# Patient Record
Sex: Female | Born: 1985 | Race: White | Hispanic: No | Marital: Married | Smoking: Never smoker
Health system: Southern US, Community
[De-identification: ages and names within clinical notes are randomized; demographics above are authoritative.]

## PROBLEM LIST (undated history)

## (undated) DIAGNOSIS — F410 Panic disorder [episodic paroxysmal anxiety] without agoraphobia: Secondary | ICD-10-CM

## (undated) DIAGNOSIS — F419 Anxiety disorder, unspecified: Secondary | ICD-10-CM

## (undated) DIAGNOSIS — G44229 Chronic tension-type headache, not intractable: Secondary | ICD-10-CM

## (undated) DIAGNOSIS — H9319 Tinnitus, unspecified ear: Secondary | ICD-10-CM

## (undated) DIAGNOSIS — J302 Other seasonal allergic rhinitis: Secondary | ICD-10-CM

## (undated) DIAGNOSIS — R42 Dizziness and giddiness: Secondary | ICD-10-CM

## (undated) DIAGNOSIS — F411 Generalized anxiety disorder: Secondary | ICD-10-CM

## (undated) DIAGNOSIS — I071 Rheumatic tricuspid insufficiency: Secondary | ICD-10-CM

## (undated) DIAGNOSIS — M9901 Segmental and somatic dysfunction of cervical region: Secondary | ICD-10-CM

## (undated) HISTORY — DX: Anxiety disorder, unspecified: F41.9

## (undated) HISTORY — DX: Segmental and somatic dysfunction of cervical region: M99.01

## (undated) HISTORY — DX: Panic disorder (episodic paroxysmal anxiety): F41.0

## (undated) HISTORY — DX: Chronic tension-type headache, not intractable: G44.229

## (undated) HISTORY — PX: WISDOM TOOTH EXTRACTION: SHX21

## (undated) HISTORY — DX: Dizziness and giddiness: R42

## (undated) HISTORY — DX: Rheumatic tricuspid insufficiency: I07.1

## (undated) HISTORY — DX: Tinnitus, unspecified ear: H93.19

## (undated) HISTORY — DX: Generalized anxiety disorder: F41.1

## (undated) HISTORY — DX: Other seasonal allergic rhinitis: J30.2

---

## 2015-04-29 LAB — LIPID PANEL
Cholesterol: 180 (ref 0–200)
HDL: 56 (ref 35–70)
LDL Cholesterol: 112
Triglycerides: 81 (ref 40–160)

## 2016-08-15 LAB — OB RESULTS CONSOLE RPR: RPR: NONREACTIVE

## 2016-08-15 LAB — OB RESULTS CONSOLE ANTIBODY SCREEN: ANTIBODY SCREEN: NEGATIVE

## 2016-08-15 LAB — OB RESULTS CONSOLE ABO/RH: RH Type: POSITIVE

## 2016-08-15 LAB — OB RESULTS CONSOLE GC/CHLAMYDIA
Chlamydia: NEGATIVE
Gonorrhea: NEGATIVE

## 2016-08-15 LAB — OB RESULTS CONSOLE HEPATITIS B SURFACE ANTIGEN: Hepatitis B Surface Ag: NEGATIVE

## 2016-08-15 LAB — OB RESULTS CONSOLE HIV ANTIBODY (ROUTINE TESTING): HIV: NONREACTIVE

## 2016-08-15 LAB — OB RESULTS CONSOLE RUBELLA ANTIBODY, IGM: RUBELLA: IMMUNE

## 2017-02-18 LAB — OB RESULTS CONSOLE GBS: GBS: NEGATIVE

## 2017-02-25 ENCOUNTER — Encounter (HOSPITAL_COMMUNITY): Payer: Self-pay | Admitting: *Deleted

## 2017-02-25 ENCOUNTER — Telehealth (HOSPITAL_COMMUNITY): Payer: Self-pay | Admitting: *Deleted

## 2017-02-25 NOTE — Telephone Encounter (Signed)
Preadmission screen  

## 2017-03-01 ENCOUNTER — Observation Stay (HOSPITAL_COMMUNITY)
Admission: RE | Admit: 2017-03-01 | Discharge: 2017-03-01 | Disposition: A | Discharge status: Home / Self Care | Payer: BLUE CROSS/BLUE SHIELD | Source: Ambulatory Visit | Attending: Obstetrics and Gynecology | Admitting: Obstetrics and Gynecology

## 2017-03-01 DIAGNOSIS — F419 Anxiety disorder, unspecified: Secondary | ICD-10-CM | POA: Diagnosis not present

## 2017-03-01 DIAGNOSIS — Z8349 Family history of other endocrine, nutritional and metabolic diseases: Secondary | ICD-10-CM | POA: Insufficient documentation

## 2017-03-01 DIAGNOSIS — Z809 Family history of malignant neoplasm, unspecified: Secondary | ICD-10-CM | POA: Insufficient documentation

## 2017-03-01 DIAGNOSIS — Z8249 Family history of ischemic heart disease and other diseases of the circulatory system: Secondary | ICD-10-CM | POA: Diagnosis not present

## 2017-03-01 DIAGNOSIS — R011 Cardiac murmur, unspecified: Secondary | ICD-10-CM | POA: Insufficient documentation

## 2017-03-01 DIAGNOSIS — Z8489 Family history of other specified conditions: Secondary | ICD-10-CM | POA: Insufficient documentation

## 2017-03-01 DIAGNOSIS — O321XX0 Maternal care for breech presentation, not applicable or unspecified: Principal | ICD-10-CM | POA: Insufficient documentation

## 2017-03-01 MED ORDER — TERBUTALINE SULFATE 1 MG/ML IJ SOLN
INTRAMUSCULAR | Status: AC
  Filled 2017-03-01: qty 1

## 2017-03-01 MED ORDER — LACTATED RINGERS IV SOLN
INTRAVENOUS | Status: DC
  Administered 2017-03-01 (×2): via INTRAVENOUS

## 2017-03-01 MED ORDER — TERBUTALINE SULFATE 1 MG/ML IJ SOLN
0.2500 mg | Freq: Once | INTRAMUSCULAR | Status: AC
  Administered 2017-03-01: 0.25 mg via SUBCUTANEOUS

## 2017-03-01 NOTE — Discharge Instructions (Signed)
Fetal Movement Counts Patient Name: ________________________________________________ Patient Due Date: ____________________ What is a fetal movement count? A fetal movement count is the number of times that you feel your baby move during a certain amount of time. This may also be called a fetal kick count. A fetal movement count is recommended for every pregnant woman. You may be asked to start counting fetal movements as early as week 28 of your pregnancy. Pay attention to when your baby is most active. You may notice your baby's sleep and wake cycles. You may also notice things that make your baby move more. You should do a fetal movement count:  When your baby is normally most active.  At the same time each day. A good time to count movements is while you are resting, after having something to eat and drink. How do I count fetal movements? 1. Find a quiet, comfortable area. Sit, or lie down on your side. 2. Write down the date, the start time and stop time, and the number of movements that you felt between those two times. Take this information with you to your health care visits. 3. For 2 hours, count kicks, flutters, swishes, rolls, and jabs. You should feel at least 10 movements during 2 hours. 4. You may stop counting after you have felt 10 movements. 5. If you do not feel 10 movements in 2 hours, have something to eat and drink. Then, keep resting and counting for 1 hour. If you feel at least 4 movements during that hour, you may stop counting. Contact a health care provider if:  You feel fewer than 4 movements in 2 hours.  Your baby is not moving like he or she usually does. Date: ____________ Start time: ____________ Stop time: ____________ Movements: ____________ Date: ____________ Start time: ____________ Stop time: ____________ Movements: ____________ Date: ____________ Start time: ____________ Stop time: ____________ Movements: ____________ Date: ____________ Start time:  ____________ Stop time: ____________ Movements: ____________ Date: ____________ Start time: ____________ Stop time: ____________ Movements: ____________ Date: ____________ Start time: ____________ Stop time: ____________ Movements: ____________ Date: ____________ Start time: ____________ Stop time: ____________ Movements: ____________ Date: ____________ Start time: ____________ Stop time: ____________ Movements: ____________ Date: ____________ Start time: ____________ Stop time: ____________ Movements: ____________ This information is not intended to replace advice given to you by your health care provider. Make sure you discuss any questions you have with your health care provider. Document Released: 01/09/2007 Document Revised: 08/08/2016 Document Reviewed: 01/19/2016 Elsevier Interactive Patient Education  2017 [employer] Braxton Hicks Contractions Contractions of the uterus can occur throughout pregnancy, but they are not always a sign that you are in labor. You may have practice contractions called Braxton Hicks contractions. These false labor contractions are sometimes confused with true labor. What are Braxton Hicks contractions? Braxton Hicks contractions are tightening movements that occur in the muscles of the uterus before labor. Unlike true labor contractions, these contractions do not result in opening (dilation) and thinning of the cervix. Toward the end of pregnancy (32-34 weeks), Braxton Hicks contractions can happen more often and may become stronger. These contractions are sometimes difficult to tell apart from true labor because they can be very uncomfortable. You should not feel embarrassed if you go to the hospital with false labor. Sometimes, the only way to tell if you are in true labor is for your health care provider to look for changes in the cervix. The health care provider will do a physical exam and may monitor your contractions. If you   are not in true labor, the exam  should show that your cervix is not dilating and your water has not broken. If there are no prenatal problems or other health problems associated with your pregnancy, it is completely safe for you to be sent home with false labor. You may continue to have Braxton Hicks contractions until you go into true labor. How can I tell the difference between true labor and false labor?  Differences  False labor  Contractions last 30-70 seconds.: Contractions are usually shorter and not as strong as true labor contractions.  Contractions become very regular.: Contractions are usually irregular.  Discomfort is usually felt in the top of the uterus, and it spreads to the lower abdomen and low back.: Contractions are often felt in the front of the lower abdomen and in the groin.  Contractions do not go away with walking.: Contractions may go away when you walk around or change positions while lying down.  Contractions usually become more intense and increase in frequency.: Contractions get weaker and are shorter-lasting as time goes on.  The cervix dilates and gets thinner.: The cervix usually does not dilate or become thin. Follow these instructions at home:  Take over-the-counter and prescription medicines only as told by your health care provider.  Keep up with your usual exercises and follow other instructions from your health care provider.  Eat and drink lightly if you think you are going into labor.  If Braxton Hicks contractions are making you uncomfortable:  Change your position from lying down or resting to walking, or change from walking to resting.  Sit and rest in a tub of warm water.  Drink enough fluid to keep your urine clear or pale yellow. Dehydration may cause these contractions.  Do slow and deep breathing several times an hour.  Keep all follow-up prenatal visits as told by your health care provider. This is important. Contact a health care provider if:  You have a  fever.  You have continuous pain in your abdomen. Get help right away if:  Your contractions become stronger, more regular, and closer together.  You have fluid leaking or gushing from your vagina.  You pass blood-tinged mucus (bloody show).  You have bleeding from your vagina.  You have low back pain that you never had before.  You feel your baby's head pushing down and causing pelvic pressure.  Your baby is not moving inside you as much as it used to. Summary  Contractions that occur before labor are called Braxton Hicks contractions, false labor, or practice contractions.  Braxton Hicks contractions are usually shorter, weaker, farther apart, and less regular than true labor contractions. True labor contractions usually become progressively stronger and regular and they become more frequent.  Manage discomfort from Braxton Hicks contractions by changing position, resting in a warm bath, drinking plenty of water, or practicing deep breathing. This information is not intended to replace advice given to you by your health care provider. Make sure you discuss any questions you have with your health care provider. Document Released: 12/10/2005 Document Revised: 10/29/2016 Document Reviewed: 10/29/2016 Elsevier Interactive Patient Education  2017 [employer] Inc[employer]  

## 2017-03-01 NOTE — H&P (Signed)
Christine NounKathryn Carey is a 31 y.o. female presenting for ECV.  Pregnancy uncomplicated except frank breech presentation. OB History    Gravida Para Term Preterm AB Living   2 1       1    SAB TAB Ectopic Multiple Live Births           1     Past Medical History:  Diagnosis Date  . Anxiety   . Heart murmur    slight regurgitation   Past Surgical History:  Procedure Laterality Date  . WISDOM TOOTH EXTRACTION     Family History: family history includes Birth defects in her daughter; Cancer in her maternal grandmother; Hypertension in her father and mother; Thyroid disease in her mother. Social History:  reports that she has never smoked. She has never used smokeless tobacco. She reports that she does not drink alcohol or use drugs.     Maternal Diabetes: No Genetic Screening: Normal Maternal Ultrasounds/Referrals: Normal Fetal Ultrasounds or other Referrals:  None Maternal Substance Abuse:  No Significant Maternal Medications:  None Significant Maternal Lab Results:  None Other Comments:  None  ROS History   Height 4\' 11"  (1.499 m), weight 166 lb (75.3 kg), last menstrual period 06/12/2016. Exam Physical Exam  Prenatal labs: ABO, Rh: B/Positive/-- (08/23 0000) Antibody: Negative (08/23 0000) Rubella: Immune (08/23 0000) RPR: Nonreactive (08/23 0000)  HBsAg: Negative (08/23 0000)  HIV: Non-reactive (08/23 0000)  GBS:     Assessment/Plan: IUP at term Homero FellersFrank breech presentation Desires attempt at ECV R&B discussed and informed consent obtained    Christine Carey 03/01/2017, 8:09 AM

## 2017-03-01 NOTE — Discharge Summary (Signed)
Physician Discharge Summary  Patient ID: Christine NounKathryn Carey MRN: [id number] DOB/AGE: 01-08-1986 31 y.o.  Admit date: 03/01/2017 Discharge date: 03/01/2017  Admission Diagnoses:  Discharge Diagnoses:  Active Problems:   * No active hospital problems. *   Discharged Condition: good  Hospital Course: Pt admitted to L&D for ECV attempt.  Successful ECV to Vertex wtihout complications,  Normal EFM after procedure for 1 hour.  Pt d/cd with labor warnings and kickcounts  Consults: None  Significant Diagnostic Studies: labs:    Treatments: ECV  Discharge Exam: Height 4\' 11"  (1.499 m), weight 166 lb (75.3 kg), last menstrual period 06/12/2016. General appearance: alert, cooperative, appears stated age and no distress ABd gravidFHR cat 1  Disposition: Final discharge disposition not confirmed  Discharge Instructions    Diet general    Complete by:  As directed    Increase activity slowly    Complete by:  As directed      Allergies as of 03/01/2017   No Known Allergies     Medication List    You have not been prescribed any medications.      Signed: Jakell Trusty C 03/01/2017, 8:23 AM

## 2017-03-01 NOTE — Procedures (Signed)
External Cephalic Version ? ?Pt was confirmed breech by ultrasound today.  She desires attempt at ECV.  Risks and benefits have been discussed at length and informed consent was obtained. ?EFM with reactive tracing and patient was given terbutaline .25mg SQ.  IV had been placed and Ultrasound at bedside. ? ?Fetal vertex in LUQ was confirmed and fetal buttocks was elevated out of the pelvis.  In a counterclockwise fashion, ECV was carried out with successful placement of fetal vertex in the cephalic position confirmed by ultrasound. ? ?Pt and fetus tolerated the procedure well.  EFM was reassuring immediately after the procedure and the patient was excited and reassured by the procedure. ?Plan to monitor EFM for 1 hour and if remains reassuring will discharge the patient home with routine labor warnings and kickcounts.  Follow up in the office as scheduled next week.  ?

## 2017-03-13 ENCOUNTER — Inpatient Hospital Stay (HOSPITAL_COMMUNITY): Admission: AD | Admit: 2017-03-13 | Payer: Self-pay | Source: Ambulatory Visit | Admitting: Obstetrics and Gynecology

## 2017-03-18 ENCOUNTER — Telehealth (HOSPITAL_COMMUNITY): Payer: Self-pay | Admitting: *Deleted

## 2017-03-18 NOTE — Telephone Encounter (Signed)
Preadmission screen  

## 2017-03-21 ENCOUNTER — Inpatient Hospital Stay (HOSPITAL_COMMUNITY)
Admission: RE | Admit: 2017-03-21 | Discharge: 2017-03-23 | DRG: 774 | Disposition: A | Discharge status: Home / Self Care | Payer: BLUE CROSS/BLUE SHIELD | Source: Ambulatory Visit | Attending: Obstetrics and Gynecology | Admitting: Obstetrics and Gynecology

## 2017-03-21 ENCOUNTER — Encounter (HOSPITAL_COMMUNITY): Payer: Self-pay

## 2017-03-21 ENCOUNTER — Inpatient Hospital Stay (HOSPITAL_COMMUNITY): Payer: BLUE CROSS/BLUE SHIELD | Admitting: Anesthesiology

## 2017-03-21 DIAGNOSIS — Z8249 Family history of ischemic heart disease and other diseases of the circulatory system: Secondary | ICD-10-CM

## 2017-03-21 DIAGNOSIS — O9089 Other complications of the puerperium, not elsewhere classified: Secondary | ICD-10-CM | POA: Diagnosis present

## 2017-03-21 DIAGNOSIS — Z3A40 40 weeks gestation of pregnancy: Secondary | ICD-10-CM

## 2017-03-21 DIAGNOSIS — G629 Polyneuropathy, unspecified: Secondary | ICD-10-CM | POA: Diagnosis present

## 2017-03-21 DIAGNOSIS — O480 Post-term pregnancy: Secondary | ICD-10-CM | POA: Diagnosis present

## 2017-03-21 DIAGNOSIS — Z3493 Encounter for supervision of normal pregnancy, unspecified, third trimester: Secondary | ICD-10-CM | POA: Diagnosis present

## 2017-03-21 LAB — TYPE AND SCREEN
ABO/RH(D): B POS
ANTIBODY SCREEN: NEGATIVE

## 2017-03-21 LAB — CBC
HCT: 32.2 % — ABNORMAL LOW (ref 36.0–46.0)
Hemoglobin: 10.3 g/dL — ABNORMAL LOW (ref 12.0–15.0)
MCH: 27.8 pg (ref 26.0–34.0)
MCHC: 32 g/dL (ref 30.0–36.0)
MCV: 87 fL (ref 78.0–100.0)
PLATELETS: 315 10*3/uL (ref 150–400)
RBC: 3.7 MIL/uL — ABNORMAL LOW (ref 3.87–5.11)
RDW: 14.4 % (ref 11.5–15.5)
WBC: 9.6 10*3/uL (ref 4.0–10.5)

## 2017-03-21 LAB — ABO/RH: ABO/RH(D): B POS

## 2017-03-21 MED ORDER — OXYTOCIN BOLUS FROM INFUSION
500.0000 mL | Freq: Once | INTRAVENOUS | Status: AC
  Administered 2017-03-21: 500 mL via INTRAVENOUS

## 2017-03-21 MED ORDER — FENTANYL 2.5 MCG/ML BUPIVACAINE 1/10 % EPIDURAL INFUSION (WH - ANES)
14.0000 mL/h | INTRAMUSCULAR | Status: DC | PRN
  Administered 2017-03-21: 14 mL/h via EPIDURAL
  Filled 2017-03-21: qty 100

## 2017-03-21 MED ORDER — SIMETHICONE 80 MG PO CHEW: 80.0000 mg | CHEWABLE_TABLET | ORAL | Status: DC | PRN

## 2017-03-21 MED ORDER — OXYCODONE-ACETAMINOPHEN 5-325 MG PO TABS: 2.0000 | ORAL_TABLET | ORAL | Status: DC | PRN

## 2017-03-21 MED ORDER — DIPHENHYDRAMINE HCL 50 MG/ML IJ SOLN: 12.5000 mg | INTRAMUSCULAR | Status: DC | PRN

## 2017-03-21 MED ORDER — PHENYLEPHRINE 40 MCG/ML (10ML) SYRINGE FOR IV PUSH (FOR BLOOD PRESSURE SUPPORT)
80.0000 ug | PREFILLED_SYRINGE | INTRAVENOUS | Status: DC | PRN
  Filled 2017-03-21: qty 5

## 2017-03-21 MED ORDER — ZOLPIDEM TARTRATE 5 MG PO TABS: 5.0000 mg | ORAL_TABLET | Freq: Every evening | ORAL | Status: DC | PRN

## 2017-03-21 MED ORDER — OXYCODONE HCL 5 MG PO TABS: 5.0000 mg | ORAL_TABLET | ORAL | Status: DC | PRN

## 2017-03-21 MED ORDER — LACTATED RINGERS IV SOLN: 500.0000 mL | Freq: Once | INTRAVENOUS | Status: DC

## 2017-03-21 MED ORDER — IBUPROFEN 600 MG PO TABS
600.0000 mg | ORAL_TABLET | Freq: Four times a day (QID) | ORAL | Status: DC
  Administered 2017-03-21 – 2017-03-23 (×7): 600 mg via ORAL
  Filled 2017-03-21 (×7): qty 1

## 2017-03-21 MED ORDER — SENNOSIDES-DOCUSATE SODIUM 8.6-50 MG PO TABS
2.0000 | ORAL_TABLET | ORAL | Status: DC
  Administered 2017-03-21 – 2017-03-22 (×2): 2 via ORAL
  Filled 2017-03-21 (×2): qty 2

## 2017-03-21 MED ORDER — PHENYLEPHRINE 40 MCG/ML (10ML) SYRINGE FOR IV PUSH (FOR BLOOD PRESSURE SUPPORT)
80.0000 ug | PREFILLED_SYRINGE | INTRAVENOUS | Status: DC | PRN
  Filled 2017-03-21: qty 5
  Filled 2017-03-21: qty 10

## 2017-03-21 MED ORDER — DIPHENHYDRAMINE HCL 25 MG PO CAPS: 25.0000 mg | ORAL_CAPSULE | Freq: Four times a day (QID) | ORAL | Status: DC | PRN

## 2017-03-21 MED ORDER — LACTATED RINGERS IV SOLN
INTRAVENOUS | Status: DC
  Administered 2017-03-21: 01:00:00 via INTRAVENOUS

## 2017-03-21 MED ORDER — MISOPROSTOL 25 MCG QUARTER TABLET
25.0000 ug | ORAL_TABLET | ORAL | Status: DC | PRN
  Administered 2017-03-21: 25 ug via VAGINAL
  Filled 2017-03-21 (×2): qty 1

## 2017-03-21 MED ORDER — EPHEDRINE 5 MG/ML INJ
10.0000 mg | INTRAVENOUS | Status: DC | PRN
  Filled 2017-03-21: qty 2

## 2017-03-21 MED ORDER — BUTORPHANOL TARTRATE 1 MG/ML IJ SOLN: 1.0000 mg | INTRAMUSCULAR | Status: DC | PRN

## 2017-03-21 MED ORDER — FLEET ENEMA 7-19 GM/118ML RE ENEM: 1.0000 | ENEMA | RECTAL | Status: DC | PRN

## 2017-03-21 MED ORDER — ONDANSETRON HCL 4 MG/2ML IJ SOLN: 4.0000 mg | INTRAMUSCULAR | Status: DC | PRN

## 2017-03-21 MED ORDER — ONDANSETRON HCL 4 MG PO TABS: 4.0000 mg | ORAL_TABLET | ORAL | Status: DC | PRN

## 2017-03-21 MED ORDER — ACETAMINOPHEN 325 MG PO TABS
650.0000 mg | ORAL_TABLET | ORAL | Status: DC | PRN
  Administered 2017-03-21 – 2017-03-22 (×3): 650 mg via ORAL
  Filled 2017-03-21 (×4): qty 2

## 2017-03-21 MED ORDER — OXYTOCIN 40 UNITS IN LACTATED RINGERS INFUSION - SIMPLE MED
2.5000 [IU]/h | INTRAVENOUS | Status: DC
  Administered 2017-03-21: 2.5 [IU]/h via INTRAVENOUS

## 2017-03-21 MED ORDER — SOD CITRATE-CITRIC ACID 500-334 MG/5ML PO SOLN: 30.0000 mL | ORAL | Status: DC | PRN

## 2017-03-21 MED ORDER — TETANUS-DIPHTH-ACELL PERTUSSIS 5-2.5-18.5 LF-MCG/0.5 IM SUSP: 0.5000 mL | Freq: Once | INTRAMUSCULAR | Status: DC

## 2017-03-21 MED ORDER — OXYTOCIN 40 UNITS IN LACTATED RINGERS INFUSION - SIMPLE MED: 1.0000 m[IU]/min | INTRAVENOUS | Status: DC

## 2017-03-21 MED ORDER — WITCH HAZEL-GLYCERIN EX PADS: 1.0000 "application " | MEDICATED_PAD | CUTANEOUS | Status: DC | PRN

## 2017-03-21 MED ORDER — ACETAMINOPHEN 325 MG PO TABS: 650.0000 mg | ORAL_TABLET | ORAL | Status: DC | PRN

## 2017-03-21 MED ORDER — BENZOCAINE-MENTHOL 20-0.5 % EX AERO
1.0000 "application " | INHALATION_SPRAY | CUTANEOUS | Status: DC | PRN
  Administered 2017-03-21: 1 via TOPICAL
  Filled 2017-03-21: qty 56

## 2017-03-21 MED ORDER — LIDOCAINE HCL (PF) 1 % IJ SOLN
INTRAMUSCULAR | Status: DC | PRN
  Administered 2017-03-21: 6 mL
  Administered 2017-03-21: 4 mL via EPIDURAL

## 2017-03-21 MED ORDER — OXYTOCIN 40 UNITS IN LACTATED RINGERS INFUSION - SIMPLE MED
1.0000 m[IU]/min | INTRAVENOUS | Status: DC
  Administered 2017-03-21: 1 m[IU]/min via INTRAVENOUS
  Filled 2017-03-21: qty 1000

## 2017-03-21 MED ORDER — COCONUT OIL OIL: 1.0000 "application " | TOPICAL_OIL | Status: DC | PRN

## 2017-03-21 MED ORDER — PRENATAL MULTIVITAMIN CH
1.0000 | ORAL_TABLET | Freq: Every day | ORAL | Status: DC
  Administered 2017-03-22 – 2017-03-23 (×2): 1 via ORAL
  Filled 2017-03-21 (×2): qty 1

## 2017-03-21 MED ORDER — OXYCODONE-ACETAMINOPHEN 5-325 MG PO TABS: 1.0000 | ORAL_TABLET | ORAL | Status: DC | PRN

## 2017-03-21 MED ORDER — ONDANSETRON HCL 4 MG/2ML IJ SOLN: 4.0000 mg | Freq: Four times a day (QID) | INTRAMUSCULAR | Status: DC | PRN

## 2017-03-21 MED ORDER — OXYCODONE HCL 5 MG PO TABS
10.0000 mg | ORAL_TABLET | ORAL | Status: DC | PRN
  Filled 2017-03-21: qty 2

## 2017-03-21 MED ORDER — ZOLPIDEM TARTRATE 5 MG PO TABS
5.0000 mg | ORAL_TABLET | Freq: Every evening | ORAL | Status: DC | PRN
  Administered 2017-03-21: 5 mg via ORAL
  Filled 2017-03-21: qty 1

## 2017-03-21 MED ORDER — LIDOCAINE HCL (PF) 1 % IJ SOLN
30.0000 mL | INTRAMUSCULAR | Status: DC | PRN
  Administered 2017-03-21: 30 mL via SUBCUTANEOUS
  Filled 2017-03-21: qty 30

## 2017-03-21 MED ORDER — LACTATED RINGERS IV SOLN: 500.0000 mL | INTRAVENOUS | Status: DC | PRN

## 2017-03-21 MED ORDER — DIBUCAINE 1 % RE OINT: 1.0000 "application " | TOPICAL_OINTMENT | RECTAL | Status: DC | PRN

## 2017-03-21 MED ORDER — TERBUTALINE SULFATE 1 MG/ML IJ SOLN
0.2500 mg | Freq: Once | INTRAMUSCULAR | Status: DC | PRN
  Filled 2017-03-21: qty 1

## 2017-03-21 NOTE — Progress Notes (Signed)
This RN assumed care from Jamey ReasL Edmonds, RN at [phone]. Both RNs at bedside.

## 2017-03-21 NOTE — Progress Notes (Signed)
Delivery addendum   Apgars 3,8  Placenta already in trash, unable to send to pathology

## 2017-03-21 NOTE — Progress Notes (Signed)
Operative Delivery Note At 7:43 PM a viable female was delivered via .  Presentation: vertex; Position: Left,, Occiput,, Anterior; Station: +3.  Verbal consent: obtained from patient.  Risks and benefits discussed in detail.  Risks include, but are not limited to the risks of anesthesia, bleeding, infection, damage to maternal tissues, fetal cephalhematoma.  There is also the risk of inability to effect vaginal delivery of the head, or shoulder dystocia that cannot be resolved by established maneuvers, leading to the need for emergency cesarean section.  Pushing to +3, FHT 70-100 Mushroom VE applied with some progress, then bell VE applied with good progress  APGAR: 6, 8; weight pending .   Placenta status:intact,path .   Cord:3 vessels  with the following complications: .  Cord pH: pending Cord around shoulder x 1 Anesthesia:  epidural Instruments: mushroom, then bell VE- 1 popoff Episiotomy:  Second degree-repaired Lacerations:   Suture Repair: 2.0 vicryl rapide Est. Blood Loss (mL):  350  Mom to postpartum.  Baby to Couplet care / Skin to Skin.  Elizeo Rodriques II,Andrick Rust E 03/21/2017, 8:04 PM

## 2017-03-21 NOTE — Progress Notes (Signed)
FHT cat one UCs q2-4 min Cx no change/vtx

## 2017-03-21 NOTE — Anesthesia Preprocedure Evaluation (Signed)
Anesthesia Evaluation  Patient identified by MRN, date of birth, ID band Patient awake    Reviewed: Allergy & Precautions, H&P , Patient's Chart, lab work & pertinent test results  Airway Mallampati: II  TM Distance: >3 FB Neck ROM: full    Dental no notable dental hx.    Pulmonary    Pulmonary exam normal breath sounds clear to auscultation       Cardiovascular Exercise Tolerance: Good  Rhythm:regular Rate:Normal     Neuro/Psych    GI/Hepatic   Endo/Other    Renal/GU      Musculoskeletal   Abdominal   Peds  Hematology   Anesthesia Other Findings   Reproductive/Obstetrics                             Anesthesia Physical Anesthesia Plan  ASA: II  Anesthesia Plan: Epidural   Post-op Pain Management:    Induction:   Airway Management Planned:   Additional Equipment:   Intra-op Plan:   Post-operative Plan:   Informed Consent: I have reviewed the patients History and Physical, chart, labs and discussed the procedure including the risks, benefits and alternatives for the proposed anesthesia with the patient or authorized representative who has indicated his/her understanding and acceptance.   Dental Advisory Given  Plan Discussed with:   Anesthesia Plan Comments: (Labs checked- platelets confirmed with RN in room. Fetal heart tracing, per RN, reported to be stable enough for sitting procedure. Discussed epidural, and patient consents to the procedure:  included risk of possible headache,backache, failed block, allergic reaction, and nerve injury. This patient was asked if she had any questions or concerns before the procedure started.)        Anesthesia Quick Evaluation  

## 2017-03-21 NOTE — H&P (Signed)
Carmelina NounKathryn Ahlberg is a 31 y.o. female presenting for IOL. Pregnancy complicated by breech now S/P ECV. S/P Cytotec early this am. OB History    Gravida Para Term Preterm AB Living   2 1       1    SAB TAB Ectopic Multiple Live Births           1     Past Medical History:  Diagnosis Date  . Anxiety   . Heart murmur    tricuspid valve prolapse   Past Surgical History:  Procedure Laterality Date  . WISDOM TOOTH EXTRACTION     Family History: family history includes Birth defects in her daughter; Cancer in her maternal grandmother; Hypertension in her father and mother; Thyroid disease in her mother. Social History:  reports that she has never smoked. She has never used smokeless tobacco. She reports that she does not drink alcohol or use drugs.     Maternal Diabetes: No Genetic Screening: Normal Maternal Ultrasounds/Referrals: Normal Fetal Ultrasounds or other Referrals:  None Maternal Substance Abuse:  No Significant Maternal Medications:  None Significant Maternal Lab Results:  None Other Comments:  None  Review of Systems  Eyes: Negative for blurred vision.  Gastrointestinal: Negative for abdominal pain.  Neurological: Negative for headaches.   Maternal Medical History:  Fetal activity: Perceived fetal activity is normal.      Dilation: 3 Effacement (%): 70 Station: -3 Exam by:: Dr. Henderson Cloudomblin Blood pressure 109/69, pulse 74, temperature 97.9 F (36.6 C), temperature source Oral, resp. rate 16, height 4\' 11"  (1.499 m), weight 165 lb (74.8 kg), last menstrual period 06/12/2016. Maternal Exam:  Uterine Assessment: Contraction strength is mild.  Contraction frequency is irregular.   Abdomen: Patient reports no abdominal tenderness. Fetal presentation: vertex     Fetal Exam Fetal State Assessment: Category I - tracings are normal.     Physical Exam  Cardiovascular: Normal rate and regular rhythm.   Respiratory: Effort normal and breath sounds normal.  GI: Soft.  There is no tenderness.  Neurological: She has normal reflexes.    Cx 3/70/soft/-3 Bedside U/S = vtx  Prenatal labs: ABO, Rh: --/--/B POS, B POS (03/29 0120) Antibody: NEG (03/29 0120) Rubella: Immune (08/23 0000) RPR: Nonreactive (08/23 0000)  HBsAg: Negative (08/23 0000)  HIV: Non-reactive (08/23 0000)  GBS: Negative (02/26 0000)   Assessment/Plan: 31 yo G2P1 @ 40 2/7 weeks for IOL Will begin pitocin D/W patient and husband induction and risks of pitocin   Analea Muller II,Floyce Bujak E 03/21/2017, 7:55 AM

## 2017-03-21 NOTE — Anesthesia Pain Management Evaluation Note (Signed)
  [facility] Pain Management Visit Note  Patient: Christine NounKathryn Matherly, 31 y.o., female  "[facility] We have an anesthesia team available at all times to provide care throughout the hospital, including epidural management and anesthesia for C-section. I don't know your plan for the delivery whether it a natural birth, water birth, IV sedation, nitrous supplementation, doula or epidural, but we want to meet your pain goals."   1.Was your pain managed to your expectations on prior hospitalizations?   Yes   2.What is your expectation for pain management during this hospitalization?     Epidural  3.How can we help you reach that goal?   Record the patient's initial score and the patient's pain goal.   Pain: 5  Pain Goal: 5 [facility] wants you to be able to say your pain was always managed very well.  Laban EmperorMalinova,Shiv Shuey Hristova 03/21/2017

## 2017-03-21 NOTE — Anesthesia Procedure Notes (Signed)
Epidural Patient location during procedure: OB  Staffing Anesthesiologist: Tylee Yum  Preanesthetic Checklist Completed: patient identified, site marked, surgical consent, pre-op evaluation, timeout performed, IV checked, risks and benefits discussed and monitors and equipment checked  Epidural Patient position: sitting Prep: site prepped and draped and DuraPrep Patient monitoring: continuous pulse ox and blood pressure Approach: midline Location: L3-L4 Injection technique: LOR air  Needle:  Needle type: Tuohy  Needle gauge: 17 G Needle length: 9 cm and 9 Needle insertion depth: 5 cm cm Catheter type: closed end flexible Catheter size: 19 Gauge Catheter at skin depth: 10 cm Test dose: negative  Assessment Events: blood not aspirated, injection not painful, no injection resistance, negative IV test and no paresthesia  Additional Notes Dosing of Epidural:  1st dose, through catheter .............................................  Xylocaine 40 mg  2nd dose, through catheter, after waiting 3 minutes.........Xylocaine 60 mg    As each dose occurred, patient was free of IV sx; and patient exhibited no evidence of SA injection.  Patient is more comfortable after epidural dosed. Please see RN's note for documentation of vital signs,and FHR which are stable.  Patient reminded not to try to ambulate with numb legs, and that an RN must be present when she attempts to get up.        

## 2017-03-21 NOTE — Progress Notes (Signed)
FHT cat one cx 6/C/-2/vtx AROM clear

## 2017-03-22 LAB — CBC
HCT: 29.9 % — ABNORMAL LOW (ref 36.0–46.0)
Hemoglobin: 10 g/dL — ABNORMAL LOW (ref 12.0–15.0)
MCH: 28.6 pg (ref 26.0–34.0)
MCHC: 33.4 g/dL (ref 30.0–36.0)
MCV: 85.4 fL (ref 78.0–100.0)
Platelets: 273 10*3/uL (ref 150–400)
RBC: 3.5 MIL/uL — AB (ref 3.87–5.11)
RDW: 14.6 % (ref 11.5–15.5)
WBC: 19.3 10*3/uL — AB (ref 4.0–10.5)

## 2017-03-22 LAB — RPR: RPR Ser Ql: NONREACTIVE

## 2017-03-22 NOTE — Anesthesia Postprocedure Evaluation (Signed)
Anesthesia Post Note  Patient: Christine Carey  Procedure(s) Performed: * No procedures listed *  Patient location during evaluation: Mother Baby Anesthesia Type: Epidural Level of consciousness: awake and alert and oriented Pain management: pain level controlled Vital Signs Assessment: post-procedure vital signs reviewed and stable Respiratory status: spontaneous breathing and nonlabored ventilation Cardiovascular status: stable Postop Assessment: no headache, no backache, epidural receding, patient able to bend at knees, no signs of nausea or vomiting and adequate PO intake Anesthetic complications: no        Last Vitals:  Vitals:   03/21/17 2320 03/22/17 0340  BP: 136/66 113/66  Pulse: (!) 59 (!) 52  Resp: 18 18  Temp: 36.8 C 36.6 C    Last Pain:  Vitals:   03/22/17 0506  TempSrc:   PainSc: 4    Pain Goal:                 Laban Emperor

## 2017-03-22 NOTE — Progress Notes (Signed)
Post Partum Day 1 Subjective: no complaints and up ad lib  Objective: Blood pressure 113/66, pulse (!) 52, temperature 97.9 F (36.6 C), temperature source Oral, resp. rate 18, height  (1.499 m), weight 165 lb (74.8 kg), last menstrual period 06/12/2016, SpO2 99 %, unknown if currently breastfeeding.  Physical Exam:  General: alert and cooperative Lochia: appropriate Uterine Fundus: firm Incision: n/a DVT Evaluation: No evidence of DVT seen on physical exam.   Recent Labs  03/21/17 0120 03/22/17 0501  HGB 10.3* 10.0*  HCT 32.2* 29.9*    Assessment/Plan: Plan for discharge tomorrow   LOS: 1 day   Christine Carey 03/22/2017, 9:06 AM

## 2017-03-22 NOTE — Lactation Note (Signed)
This note was copied from a baby's chart. Lactation Consultation Note experienced BF mom has a 2 1/30 yr old daughter that she BF for 2 yrs. Mom states this baby has latched ok. Had some good feedings then became sleepy.  Reviewed newborn feeding habits and behavior. Mom encouraged to feed baby 8-12 times/24 hours and with feeding cues. Mom encouraged to waken baby for feeds.  Encouraged STS and I&O. Encouraged mom to call for any assistance, questions, or concerns.  [facility]/LC brochure given w/resources, support groups and LC services. Patient Name: Christine Carey [id number] Date: 03/22/2017 Reason for consult: Initial assessment   Maternal Data Has patient been taught Hand Expression?: Yes Does the patient have breastfeeding experience prior to this delivery?: Yes  Feeding Feeding Type: Breast Fed Length of feed: 5 min  LATCH Score/Interventions                      Lactation Tools Discussed/Used     Consult Status Consult Status: Follow-up Date: 03/23/17 Follow-up type: In-patient    Charyl Dancer 03/22/2017, 6:13 AM

## 2017-03-22 NOTE — Lactation Note (Signed)
This note was copied from a baby's chart. Lactation Consultation Note: Experienced BF mom has baby latched to the breast when I went into room. Lots of swallows noted. Nipple slightly pinched when baby came off the breast Encouraged to keep baby close to the breast throughout the feeding. Mom able to hand express whitish milk after nursing. Baby off to sleep. No questions at present. To call prn  Patient Name: Christine Carey [id number]'[id number] Date: 03/22/2017 Reason for consult: Follow-up assessment   Maternal Data Formula Feeding for Exclusion: No Has patient been taught Hand Expression?: Yes Does the patient have breastfeeding experience prior to this delivery?: Yes  Feeding Feeding Type: Breast Fed Length of feed: 15 min  LATCH Score/Interventions Latch: Grasps breast easily, tongue down, lips flanged, rhythmical sucking.  Audible Swallowing: Spontaneous and intermittent  Type of Nipple: Everted at rest and after stimulation  Comfort (Breast/Nipple): Soft / non-tender     Hold (Positioning): No assistance needed to correctly position infant at breast.  LATCH Score: 10  Lactation Tools Discussed/Used [facility] Program: No   Consult Status Consult Status: PRN    Pamelia Hoit 03/22/2017, 1:59 PM

## 2017-03-22 NOTE — Progress Notes (Signed)
Patient complaining of numbness and weakness right lower extremity since delivery yesterday pm. She had a labor epidural placed yesterday which ran for about 7 hours. Epidural insertion was uneventful with no reported paresthesias. She states she some right sided pelvic pain during her labor and was lying predominantly on her right side. She pushed for approximately 45 min with her legs in the hyperflexed position. She had persistent numbness and weakness in her right leg. She states that the numbness is primarily in her right thigh. She reports difficulty with ambulation, though this has improved since this am. She denies any bowel or bladder dysfunction. 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 Discussed with the patient and her relatives. Will reevaluate in the am prior to discharge.

## 2017-03-23 MED ORDER — IBUPROFEN 600 MG PO TABS: 600.0000 mg | ORAL_TABLET | Freq: Four times a day (QID) | ORAL | 0 refills | Status: DC

## 2017-03-23 NOTE — Discharge Summary (Signed)
Obstetric Discharge Summary Reason for Admission: induction of labor Prenatal Procedures: ultrasound Intrapartum Procedures: spontaneous vaginal delivery Postpartum Procedures: none Complications-Operative and Postpartum: mild neuropathy Hemoglobin  Date Value Ref Range Status  03/22/2017 10.0 (L) 12.0 - 15.0 g/dL Final   HCT  Date Value Ref Range Status  03/22/2017 29.9 (L) 36.0 - 46.0 % Final    Physical Exam:  General: alert and cooperative Lochia: appropriate Uterine Fundus: firm Incision: no significant drainage DVT Evaluation: No evidence of DVT seen on physical exam.  Discharge Diagnoses: Term Pregnancy-delivered  Discharge Information: Date: 03/23/2017 Activity: pelvic rest Diet: routine Medications: PNV, Ibuprofen and Percocet Condition: stable Instructions: refer to practice specific booklet Discharge to: home Follow-up Information    [facility]. Schedule an appointment as soon as possible for a visit in 6 week(s).   Contact information: [address] [place] [address] [phone]           Newborn Data: Live born female  Birth Weight: 8 lb 8.2 oz (3860 g) APGAR: 3, 8  Home with mother.  Zohair Epp 03/23/2017, 8:49 AM

## 2017-03-23 NOTE — Lactation Note (Signed)
This note was copied from a baby's chart. Lactation Consultation Note  Patient Name: Christine Carey [id number] Date: 03/23/2017 Reason for consult: Follow-up assessment;Infant weight loss  LC reviewed doc flow sheets and updated per mom  LC assisted with latch and showed mom depth , multiple swallows  Noted, increased with breast compressions. - see doc flow sheets for details  Sore nipple and and engorgement prevention and tx. Per mom familiar with engorgement  From her 1st baby. LC reviewed the importance of STS feedings until the baby can stay awake for a feeding. Discussed nutritive vs non- nutritive feeding patterns, and watch for hanging out when latched.  Mother informed of post-discharge support and given phone number to the lactation department, including services for phone call assistance; out-patient appointments; and breastfeeding support group. List of other breastfeeding resources in the community given in the handout. Encouraged mother to call for problems or concerns related to breastfeeding.   Maternal Data    Feeding Feeding Type: Breast Fed Length of feed: 10 min (multiple swallows )  LATCH Score/Interventions Latch: Grasps breast easily, tongue down, lips flanged, rhythmical sucking. Intervention(s): Adjust position;Assist with latch;Breast massage;Breast compression  Audible Swallowing: Spontaneous and intermittent  Type of Nipple: Everted at rest and after stimulation  Comfort (Breast/Nipple): Filling, red/small blisters or bruises, mild/mod discomfort     Hold (Positioning): Assistance needed to correctly position infant at breast and maintain latch. Intervention(s): Breastfeeding basics reviewed;Support Pillows;Position options;Skin to skin  LATCH Score: 8  Lactation Tools Discussed/Used Pump Review: Setup, frequency, and cleaning Initiated by:: MAI  Date initiated:: 03/23/17   Consult Status Consult Status: Complete Date: 03/23/17    Kathrin Greathouse 03/23/2017, 9:48 AM

## 2017-04-08 ENCOUNTER — Ambulatory Visit: Payer: BLUE CROSS/BLUE SHIELD | Admitting: Neurology

## 2017-04-08 ENCOUNTER — Telehealth: Payer: Self-pay | Admitting: *Deleted

## 2017-04-08 NOTE — Telephone Encounter (Signed)
Left message letting her know our office is without power.  Provided our number to call back to reschedule. 

## 2017-04-24 ENCOUNTER — Ambulatory Visit: Payer: BLUE CROSS/BLUE SHIELD | Admitting: Diagnostic Neuroimaging

## 2018-04-23 ENCOUNTER — Ambulatory Visit (INDEPENDENT_AMBULATORY_CARE_PROVIDER_SITE_OTHER): Payer: BLUE CROSS/BLUE SHIELD | Admitting: Family Medicine

## 2018-04-23 ENCOUNTER — Other Ambulatory Visit: Payer: Self-pay

## 2018-04-23 ENCOUNTER — Encounter: Payer: Self-pay | Admitting: Family Medicine

## 2018-04-23 VITALS — BP 104/62 | HR 66 | Temp 98.7°F | Ht 59.5 in | Wt 119.2 lb

## 2018-04-23 DIAGNOSIS — Z975 Presence of (intrauterine) contraceptive device: Secondary | ICD-10-CM | POA: Diagnosis not present

## 2018-04-23 DIAGNOSIS — F411 Generalized anxiety disorder: Secondary | ICD-10-CM | POA: Insufficient documentation

## 2018-04-23 DIAGNOSIS — F419 Anxiety disorder, unspecified: Secondary | ICD-10-CM | POA: Diagnosis not present

## 2018-04-23 NOTE — Progress Notes (Signed)
Subjective  CC:  Chief Complaint  Patient presents with  . Establish Care    Transfer from [facility], Dr. Irving Burton- Moved to [place]  . Anxiety    HPI: Christine Carey is a 32 y.o. female who presents to [facility] today to establish care with me as a new patient.   She has the following concerns or needs:  Very pleasant healthy 32 yo G2P2002 with a 71 yo and 1 yo daughter. Has mirena IUD in place. Doing well physically.   Main concern is worry/anxiety; reports she has "always" been an anxious person since adolescence. It has never caused a problem. However, now with 89 yo daughter who has a unilateral kidney, congenital. She finds herself constantly worried about her health. She is over vigilant. Feels anxious and worries that something will happen to that kidney. She denies low mood, panic attacks or anxiety negatively impacting her sleep, appetite, motivation or relationships. Her husband is reassuring and supportive (he is  Teacher, early years/pre). However, she would like help to decrease the constant stress.  GAD 7 : Generalized Anxiety Score 04/23/2018  Nervous, Anxious, on Edge 1  Control/stop worrying 1  Worry too much - different things 2  Trouble relaxing 1  Restless 0  Easily annoyed or irritable 1  Afraid - awful might happen 2  Total GAD 7 Score 8  Anxiety Difficulty Somewhat difficult   PHQ2 score 0   We updated and reviewed the patient's past history in detail and it is documented below.  Patient Active Problem List   Diagnosis Date Noted  . IUD (intrauterine device) in place - May 2018, Mirena 04/23/2018    Dr. Vickey Sages   . Anxiety 04/23/2018   Health Maintenance  Topic Date Due  . INFLUENZA VACCINE  07/24/2018  . PAP SMEAR  05/23/2020  . TETANUS/TDAP  09/09/2024  . HIV Screening  Completed   Immunization History  Administered Date(s) Administered  . Influenza, High Dose Seasonal PF 08/21/2014  . Influenza,inj,Quad PF,6+ Mos  08/31/2017  . Influenza-Unspecified 09/21/2015, 08/31/2017  . Tdap 05/25/2014, 09/06/2014, 09/09/2014   No outpatient medications have been marked as taking for the 04/23/18 encounter (Office Visit) with Willow Ora, MD.    Allergies: Patient has No Known Allergies. Past Medical History Patient  has a past medical history of Anxiety. Past Surgical History Patient  has a past surgical history that includes Wisdom tooth extraction. Family History: Patient family history includes Birth defects in her daughter; Healthy in her brother and daughter; Hyperlipidemia in her father and mother; Hypertension in her father and mother; Lung cancer in her maternal grandmother; Stroke in her paternal grandfather and paternal grandmother. Social History:  Patient  reports that she has never smoked. She has never used smokeless tobacco. She reports that she does not drink alcohol or use drugs.  Review of Systems: Constitutional: negative for fever or malaise Ophthalmic: negative for photophobia, double vision or loss of vision Cardiovascular: negative for chest pain, dyspnea on exertion, or new LE swelling Respiratory: negative for SOB or persistent cough Gastrointestinal: negative for abdominal pain, change in bowel habits or melena Genitourinary: negative for dysuria or gross hematuria Musculoskeletal: negative for new gait disturbance or muscular weakness Integumentary: negative for new or persistent rashes Neurological: negative for TIA or stroke symptoms Psychiatric: negative for SI or delusions Allergic/Immunologic: negative for hives  Patient Care Team    Relationship Specialty Notifications Start End  Willow Ora, MD PCP - General Family  Medicine  04/23/18   Zelphia Cairo, MD Consulting Physician Obstetrics and Gynecology  04/23/18     Objective  Vitals: BP 104/62   Pulse 66   Temp 98.7 F (37.1 C)   Ht 4' 11.5" (1.511 m)   Wt 119 lb 3.2 oz (54.1 kg)   BMI 23.67 kg/m    General:  Well developed, well nourished, no acute distress however becomes tearful during interview Psych:  Alert and oriented,normal mood and affect HEENT:  Normocephalic, atraumatic, non-icteric sclera, supple neck without adenopathy, mass or thyromegaly Cardiovascular:  RRR without gallop, rub or murmur, nondisplaced PMI Respiratory:  Good breath sounds bilaterally, CTAB with normal respiratory effort Neurologic:    Mental status is normal. Gross motor and sensory exams are normal. Normal gait, no tremor  Labs: reports normal thyroid testing in past  Assessment  1. Anxiety   2. IUD (intrauterine device) in place - May 2018, Mirena      Plan   Today's visit was 45 minutes long. Greater than 50% of this time was devoted to face to face counseling with the patient and coordination of care. We discussed her diagnosis, prognosis, treatment options and treatment plan is documented below.   Chronic anxiety: now intensified due to constant worry regarding daughter's health. Lives in fear. Has several irrational fears. Recommend CBT w/ Dr. Gery Pray, then f/u with me if not improving to consider medication therapy.   HM is up to date. Has female wellness exam with gyn this summer.   Follow up:  Return if symptoms worsen or fail to improve.  Commons side effects, risks, benefits, and alternatives for medications and treatment plan prescribed today were discussed, and the patient expressed understanding of the given instructions. Patient is instructed to call or message via MyChart if he/she has any questions or concerns regarding our treatment plan. No barriers to understanding were identified. We discussed Red Flag symptoms and signs in detail. Patient expressed understanding regarding what to do in case of urgent or emergency type symptoms.   Medication list was reconciled, printed and provided to the patient in AVS. Patient instructions and summary information was reviewed with the patient  as documented in the AVS. This note was prepared with assistance of Dragon voice recognition software. Occasional wrong-word or sound-a-like substitutions may have occurred due to the inherent limitations of voice recognition software  No orders of the defined types were placed in this encounter.  No orders of the defined types were placed in this encounter.

## 2018-04-23 NOTE — Patient Instructions (Signed)
Thank you for establishing care with me!  Follow-Up with me as Needed.   Please call [place] [phone]) to schedule an appointment with Dr. Gaynell Face to discuss stress/axiety    Stress and Stress Management Stress is a normal reaction to life events. It is what you feel when life demands more than you are used to or more than you can handle. Some stress can be useful. For example, the stress reaction can help you catch the last bus of the day, study for a test, or meet a deadline at work. But stress that occurs too often or for too long can cause problems. It can affect your emotional health and interfere with relationships and normal daily activities. Too much stress can weaken your immune system and increase your risk for physical illness. If you already have a medical problem, stress can make it worse. What are the causes? All sorts of life events may cause stress. An event that causes stress for one person may not be stressful for another person. Major life events commonly cause stress. These may be positive or negative. Examples include losing your job, moving into a new home, getting married, having a baby, or losing a loved one. Less obvious life events may also cause stress, especially if they occur day after day or in combination. Examples include working long hours, driving in traffic, caring for children, being in debt, or being in a difficult relationship. What are the signs or symptoms? Stress may cause emotional symptoms including, the following:  Anxiety. This is feeling worried, afraid, on edge, overwhelmed, or out of control.  Anger. This is feeling irritated or impatient.  Depression. This is feeling sad, down, helpless, or guilty.  Difficulty focusing, remembering, or making decisions.  Stress may cause physical symptoms, including the following:  Aches and pains. These may affect your head, neck, back, stomach, or other areas of your body.  Tight  muscles or clenched jaw.  Low energy or trouble sleeping.  Stress may cause unhealthy behaviors, including the following:  Eating to feel better (overeating) or skipping meals.  Sleeping too little, too much, or both.  Working too much or putting off tasks (procrastination).  Smoking, drinking alcohol, or using drugs to feel better.  How is this diagnosed? Stress is diagnosed through an assessment by your health care provider. Your health care provider will ask questions about your symptoms and any stressful life events.Your health care provider will also ask about your medical history and may order blood tests or other tests. Certain medical conditions and medicine can cause physical symptoms similar to stress. Mental illness can cause emotional symptoms and unhealthy behaviors similar to stress. Your health care provider may refer you to a mental health professional for further evaluation. How is this treated? Stress management is the recommended treatment for stress.The goals of stress management are reducing stressful life events and coping with stress in healthy ways. Techniques for reducing stressful life events include the following:  Stress identification. Self-monitor for stress and identify what causes stress for you. These skills may help you to avoid some stressful events.  Time management. Set your priorities, keep a calendar of events, and learn to say "no." These tools can help you avoid making too many commitments.  Techniques for coping with stress include the following:  Rethinking the problem. Try to think realistically about stressful events rather than ignoring them or overreacting. Try to find the positives in a stressful situation rather than focusing on the negatives.  Exercise. Physical exercise can release both physical and emotional tension. The key is to find a form of exercise you enjoy and do it regularly.  Relaxation techniques. These relax the body and  mind. Examples include yoga, meditation, tai chi, biofeedback, deep breathing, progressive muscle relaxation, listening to music, being out in nature, journaling, and other hobbies. Again, the key is to find one or more that you enjoy and can do regularly.  Healthy lifestyle. Eat a balanced diet, get plenty of sleep, and do not smoke. Avoid using alcohol or drugs to relax.  Strong support network. Spend time with family, friends, or other people you enjoy being around.Express your feelings and talk things over with someone you trust.  Counseling or talktherapy with a mental health professional may be helpful if you are having difficulty managing stress on your own. Medicine is typically not recommended for the treatment of stress.Talk to your health care provider if you think you need medicine for symptoms of stress. Follow these instructions at home:  Keep all follow-up visits as directed by your health care provider.  Take all medicines as directed by your health care provider. Contact a health care provider if:  Your symptoms get worse or you start having new symptoms.  You feel overwhelmed by your problems and can no longer manage them on your own. Get help right away if:  You feel like hurting yourself or someone else. This information is not intended to replace advice given to you by your health care provider. Make sure you discuss any questions you have with your health care provider. Document Released: 06/05/2001 Document Revised: 05/17/2016 Document Reviewed: 08/04/2013 Elsevier Interactive Patient Education  2017 [employer].

## 2018-04-30 ENCOUNTER — Encounter: Payer: Self-pay | Admitting: Emergency Medicine

## 2018-05-12 ENCOUNTER — Telehealth: Payer: Self-pay | Admitting: Family Medicine

## 2018-05-12 NOTE — Telephone Encounter (Signed)
Copied from CRM [id number]. Topic: Quick Communication - See Telephone Encounter >> May 12, 2018  9:27 AM Rudi Coco, NT wrote: CRM for notification. See Telephone encounter for: 05/12/18.  Pt. Wanted to speak with someone concerning appt, 04/23/18 (coding) pt. Has already spoke with billing but was to to call back to office. Pt can be reached at [phone]

## 2018-05-16 NOTE — Telephone Encounter (Signed)
Called patient and advised that after review her visit does not me any preventative criteria. Apologized as well.

## 2018-07-01 ENCOUNTER — Ambulatory Visit (INDEPENDENT_AMBULATORY_CARE_PROVIDER_SITE_OTHER): Payer: BLUE CROSS/BLUE SHIELD | Admitting: Clinical

## 2018-07-01 DIAGNOSIS — F419 Anxiety disorder, unspecified: Secondary | ICD-10-CM

## 2018-08-07 ENCOUNTER — Ambulatory Visit: Payer: BLUE CROSS/BLUE SHIELD | Admitting: Clinical

## 2018-08-26 ENCOUNTER — Encounter: Payer: Self-pay | Admitting: Family Medicine

## 2018-08-26 ENCOUNTER — Ambulatory Visit (INDEPENDENT_AMBULATORY_CARE_PROVIDER_SITE_OTHER): Payer: BLUE CROSS/BLUE SHIELD | Admitting: Family Medicine

## 2018-08-26 ENCOUNTER — Other Ambulatory Visit: Payer: Self-pay

## 2018-08-26 VITALS — BP 100/62 | HR 77 | Temp 99.4°F | Ht 59.5 in | Wt 126.6 lb

## 2018-08-26 DIAGNOSIS — M26622 Arthralgia of left temporomandibular joint: Secondary | ICD-10-CM

## 2018-08-26 DIAGNOSIS — F419 Anxiety disorder, unspecified: Secondary | ICD-10-CM

## 2018-08-26 NOTE — Progress Notes (Signed)
Subjective  CC:  Chief Complaint  Patient presents with  . Ear Pain    L Sided ear pain and facial pain since this Am     HPI: Christine Carey is a 32 y.o. female who presents to the office today to address the problems listed above in the chief complaint.  Awoke this am with left headache, sore jaw, fullness in ear; ? Sinus infection but no purulent drainage, cough, malaise, fever or sinus pain. Also with sore upper back muscles. Hosted her in-laws at her home over the holiday weekend; admits tends to "overdo" - obsesses about everything ... Cleans, worries etc... No n/v or neuro sxs.  Anxiety: had initial visit with therapist but it cost "a lot". Did speak to OB about her sxs as well; she may be OCD in addition to anxious; started low dose sertraline but she did not tolerate it at all. She is working on Chart[employer] loss adjuster. Gaining insight. Still anxious and worries about the health of her daughter.    Assessment  1. Arthralgia of left temporomandibular joint   2. Anxiety      Plan   TMJ:  Seems likely to be TMJ and stress related.  ? Bruxism. May be early sinusitis but not certain. Recommend monitoring sxs. Will call in abx if worsen. Start stress reduction, massage and advil.   Anxiety +/- OCD:  rec f/u with therapist if can be affordable. May have cash pay discount. Start meditation for clearing the mind and for relaxation. Learn more about OCD.   Follow up: Return if symptoms worsen or fail to improve.   No orders of the defined types were placed in this encounter.  No orders of the defined types were placed in this encounter.     I reviewed the patients updated PMH, FH, and SocHx.    Patient Active Problem List   Diagnosis Date Noted  . IUD (intrauterine device) in place - May 2018, Mirena 04/23/2018  . Anxiety 04/23/2018   Current Meds  Medication Sig  . levonorgestrel (MIRENA) 20 MCG/24HR IUD 1 each by Intrauterine route once.  . Prenatal Multivit-Min-Fe-FA  (PRENATAL VITAMINS PO) Take by mouth.    Allergies: Patient has No Known Allergies. Family History: Patient family history includes Birth defects in her daughter; Healthy in her brother and daughter; Hyperlipidemia in her father and mother; Hypertension in her father and mother; Lung cancer in her maternal grandmother; Stroke in her paternal grandfather and paternal grandmother. Social History:  Patient  reports that she has never smoked. She has never used smokeless tobacco. She reports that she does not drink alcohol or use drugs.  Review of Systems: Constitutional: Negative for fever malaise or anorexia Cardiovascular: negative for chest pain Respiratory: negative for SOB or persistent cough Gastrointestinal: negative for abdominal pain  Objective  Vitals: BP 100/62   Pulse 77   Temp 99.4 F (37.4 C)   Ht 4' 11.5" (1.511 m)   Wt 126 lb 9.6 oz (57.4 kg)   SpO2 100%   BMI 25.14 kg/m  General: no acute distress , A&Ox3 HEENT: PEERL, conjunctiva normal, Oropharynx moist,neck is supple without lad, tender traps w/o knots, TMs nl bilaterally, nasal mucosa is boggy with clear drainage. No max sinus ttp, Bilateral TM joints subluxation with mild soreness on left. No crepitus Cardiovascular:  RRR without murmur or gallop.  Respiratory:  Good breath sounds bilaterally, CTAB with normal respiratory effort Skin:  Warm, no rashes Psych: nl affect, good insight, nl speech  Commons side effects, risks, benefits, and alternatives for medications and treatment plan prescribed today were discussed, and the patient expressed understanding of the given instructions. Patient is instructed to call or message via MyChart if he/she has any questions or concerns regarding our treatment plan. No barriers to understanding were identified. We discussed Red Flag symptoms and signs in detail. Patient expressed understanding regarding what to do in case of urgent or emergency type symptoms.   Medication  list was reconciled, printed and provided to the patient in AVS. Patient instructions and summary information was reviewed with the patient as documented in the AVS. This note was prepared with assistance of Dragon voice recognition software. Occasional wrong-word or sound-a-like substitutions may have occurred due to the inherent limitations of voice recognition software

## 2018-08-26 NOTE — Patient Instructions (Signed)
Try ibuprofen and monitor your symptoms.   See if you are grinding your teeth at night.   IF you develop sinus infection symptoms, message me on Mychart.

## 2020-04-08 ENCOUNTER — Telehealth: Payer: BLUE CROSS/BLUE SHIELD | Admitting: Physician Assistant

## 2020-04-13 ENCOUNTER — Other Ambulatory Visit: Payer: Self-pay

## 2020-04-13 ENCOUNTER — Encounter: Payer: Self-pay | Admitting: Family Medicine

## 2020-04-13 ENCOUNTER — Ambulatory Visit (INDEPENDENT_AMBULATORY_CARE_PROVIDER_SITE_OTHER): Payer: BLUE CROSS/BLUE SHIELD | Admitting: Family Medicine

## 2020-04-13 VITALS — BP 118/70 | HR 74 | Temp 98.5°F | Resp 15 | Ht 60.0 in | Wt 136.6 lb

## 2020-04-13 DIAGNOSIS — J301 Allergic rhinitis due to pollen: Secondary | ICD-10-CM

## 2020-04-13 DIAGNOSIS — H8110 Benign paroxysmal vertigo, unspecified ear: Secondary | ICD-10-CM

## 2020-04-13 NOTE — Patient Instructions (Addendum)
Please return for your annual complete physical; please come fasting.06/24/2020  I have referred your for PT for vestibular rehab. This should help your vertigo.  Continue your allergy medications; they should help.    If you have any questions or concerns, please don't hesitate to send me a message via MyChart or call the office at [phone]. Thank you for visiting with Christine Carey today! It's our pleasure caring for you.   Benign Positional Vertigo Vertigo is the feeling that you or your surroundings are moving when they are not. Benign positional vertigo is the most common form of vertigo. This is usually a harmless condition (benign). This condition is positional. This means that symptoms are triggered by certain movements and positions. This condition can be dangerous if it occurs while you are doing something that could cause harm to you or others. This includes activities such as driving or operating machinery. What are the causes? In many cases, the cause of this condition is not known. It may be caused by a disturbance in an area of the inner ear that helps your brain to sense movement and balance. This disturbance can be caused by:  Viral infection (labyrinthitis).  Head injury.  Repetitive motion, such as jumping, dancing, or running. What increases the risk? You are more likely to develop this condition if:  You are a woman.  You are 51 years of age or older. What are the signs or symptoms? Symptoms of this condition usually happen when you move your head or your eyes in different directions. Symptoms may start suddenly, and usually last for less than a minute. They include:  Loss of balance and falling.  Feeling like you are spinning or moving.  Feeling like your surroundings are spinning or moving.  Nausea and vomiting.  Blurred vision.  Dizziness.  Involuntary eye movement (nystagmus). Symptoms can be mild and cause only minor problems, or they can be severe and  interfere with daily life. Episodes of benign positional vertigo may return (recur) over time. Symptoms may improve over time. How is this diagnosed? This condition may be diagnosed based on:  Your medical history.  Physical exam of the head, neck, and ears.  Tests, such as: ? MRI. ? CT scan. ? Eye movement tests. Your health care provider may ask you to change positions quickly while he or she watches you for symptoms of benign positional vertigo, such as nystagmus. Eye movement may be tested with a variety of exams that are designed to evaluate or stimulate vertigo. ? An electroencephalogram (EEG). This records electrical activity in your brain. ? Hearing tests. You may be referred to a health care provider who specializes in ear, nose, and throat (ENT) problems (otolaryngologist) or a provider who specializes in disorders of the nervous system (neurologist). How is this treated?  This condition may be treated in a session in which your health care provider moves your head in specific positions to adjust your inner ear back to normal. Treatment for this condition may take several sessions. Surgery may be needed in severe cases, but this is rare. In some cases, benign positional vertigo may resolve on its own in 2-4 weeks. Follow these instructions at home: Safety  Move slowly. Avoid sudden body or head movements or certain positions, as told by your health care provider.  Avoid driving until your health care provider says it is safe for you to do so.  Avoid operating heavy machinery until your health care provider says it is safe for you  to do so.  Avoid doing any tasks that would be dangerous to you or others if vertigo occurs.  If you have trouble walking or keeping your balance, try using a cane for stability. If you feel dizzy or unstable, sit down right away.  Return to your normal activities as told by your health care provider. Ask your health care provider what activities  are safe for you. General instructions  Take over-the-counter and prescription medicines only as told by your health care provider.  Drink enough fluid to keep your urine pale yellow.  Keep all follow-up visits as told by your health care provider. This is important. Contact a health care provider if:  You have a fever.  Your condition gets worse or you develop new symptoms.  Your family or friends notice any behavioral changes.  You have nausea or vomiting that gets worse.  You have numbness or a "pins and needles" sensation. Get help right away if you:  Have difficulty speaking or moving.  Are always dizzy.  Faint.  Develop severe headaches.  Have weakness in your legs or arms.  Have changes in your hearing or vision.  Develop a stiff neck.  Develop sensitivity to light. Summary  Vertigo is the feeling that you or your surroundings are moving when they are not. Benign positional vertigo is the most common form of vertigo.  The cause of this condition is not known. It may be caused by a disturbance in an area of the inner ear that helps your brain to sense movement and balance.  Symptoms include loss of balance and falling, feeling that you or your surroundings are moving, nausea and vomiting, and blurred vision.  This condition can be diagnosed based on symptoms, physical exam, and other tests, such as MRI, CT scan, eye movement tests, and hearing tests.  Follow safety instructions as told by your health care provider. You will also be told when to contact your health care provider in case of problems. This information is not intended to replace advice given to you by your health care provider. Make sure you discuss any questions you have with your health care provider. Document Revised: 05/21/2018 Document Reviewed: 05/21/2018 Elsevier Patient Education  [place].

## 2020-04-13 NOTE — Progress Notes (Signed)
Subjective  CC:  Chief Complaint  Patient presents with  . Dizziness    intially epidsode on 3/27, reoccured a week later, happened after mowing the yard, has just felt off balance over the past several weeks, recent UC visit - prescribed Meclizine, dizziness stopped but caused extreme fatigue  . sinus pressure    having to sleep upright, ears "popping", sinus drainage, has seen allergist - started Allegra and nasal spray, symptoms somewhat improved    HPI: Christine Carey is a 34 y.o. female who presents to the office today to address the problems listed above in the chief complaint.  34 yo with allergy sxs treated by Dr. Orvil Feil a few weeks ago. mildlyimproved but still with pressure in ears.   Vertigo with head mvt. Evaluated by UC initially. Now with intermittent sxs with mvt. No headaches, diplopia or neuro sxs. No ataxia or falls. Has right ear ringing. Dad has menierie's she is concerned about this. Also worries about ear infections.   Assessment  1. Seasonal allergic rhinitis due to pollen   2. Benign paroxysmal positional vertigo, unspecified laterality      Plan   SAR:  Reassured. Continue meds and f/u with allergy if not improving. No signs of infection at this time  BPPV: reassured. rec vestibular PT. Monitor for hearing loss, worsening tinnitus or balance issues: will work up for meniere's if needed.   Follow up: Return for complete physical.  06/24/2020  Orders Placed This Encounter  Procedures  . PT vestibular rehab   No orders of the defined types were placed in this encounter.     I reviewed the patients updated PMH, FH, and SocHx.    Patient Active Problem List   Diagnosis Date Noted  . IUD (intrauterine device) in place - May 2018, Mirena 04/23/2018  . Anxiety 04/23/2018   Current Meds  Medication Sig  . b complex vitamins capsule Take 1 capsule by mouth daily.  . cholecalciferol (VITAMIN D3) 25 MCG (1000 UNIT) tablet Take 1,000 Units by mouth daily.   Marland Kitchen Fexofenadine HCl (ALLEGRA ALLERGY PO) Take by mouth.  . levonorgestrel (MIRENA) 20 MCG/24HR IUD 1 each by Intrauterine route once.  Marland Kitchen MAGNESIUM CITRATE PO Take by mouth.  . MECLIZINE HCL PO Take by mouth.  . Oxymetazoline HCl (NASAL SPRAY 12 HOUR NA) Place into the nose.  . Prenatal Multivit-Min-Fe-FA (PRENATAL VITAMINS PO) Take by mouth.    Allergies: Patient has No Known Allergies. Family History: Patient family history includes Birth defects in her daughter; Healthy in her brother and daughter; Hyperlipidemia in her father and mother; Hypertension in her father and mother; Lung cancer in her maternal grandmother; Stroke in her paternal grandfather and paternal grandmother. Social History:  Patient  reports that she has never smoked. She has never used smokeless tobacco. She reports that she does not drink alcohol or use drugs.  Review of Systems: Constitutional: Negative for fever malaise or anorexia Cardiovascular: negative for chest pain Respiratory: negative for SOB or persistent cough Gastrointestinal: negative for abdominal pain  Objective  Vitals: BP 118/70   Pulse 74   Temp 98.5 F (36.9 C) (Temporal)   Resp 15   Ht 5' (1.524 m)   Wt 136 lb 9.6 oz (62 kg)   SpO2 99%   BMI 26.68 kg/m  General: no acute distress , A&Ox3 HEENT: PEERL, conjunctiva normal, neck is supple. Nl tms and nasal mucosa Cardiovascular:  RRR without murmur or gallop.  Respiratory:  Good breath sounds bilaterally,  CTAB with normal respiratory effort Skin:  Warm, no rashes Neuro: no nystagmus. Mild vertigo with head mvt with fixed gaze, nl  gait    Commons side effects, risks, benefits, and alternatives for medications and treatment plan prescribed today were discussed, and the patient expressed understanding of the given instructions. Patient is instructed to call or message via MyChart if he/she has any questions or concerns regarding our treatment plan. No barriers to understanding were  identified. We discussed Red Flag symptoms and signs in detail. Patient expressed understanding regarding what to do in case of urgent or emergency type symptoms.   Medication list was reconciled, printed and provided to the patient in AVS. Patient instructions and summary information was reviewed with the patient as documented in the AVS. This note was prepared with assistance of Dragon voice recognition software. Occasional wrong-word or sound-a-like substitutions may have occurred due to the inherent limitations of voice recognition software  This visit occurred during the SARS-CoV-2 public health emergency.  Safety protocols were in place, including screening questions prior to the visit, additional usage of staff PPE, and extensive cleaning of exam room while observing appropriate contact time as indicated for disinfecting solutions.

## 2020-04-18 ENCOUNTER — Other Ambulatory Visit: Payer: Self-pay

## 2020-04-18 DIAGNOSIS — H8110 Benign paroxysmal vertigo, unspecified ear: Secondary | ICD-10-CM

## 2020-04-24 ENCOUNTER — Emergency Department (HOSPITAL_COMMUNITY)
Admission: EM | Admit: 2020-04-24 | Discharge: 2020-04-24 | Disposition: A | Discharge status: Home / Self Care | Payer: BC Managed Care – PPO | Attending: Emergency Medicine | Admitting: Emergency Medicine

## 2020-04-24 ENCOUNTER — Encounter (HOSPITAL_COMMUNITY): Payer: Self-pay | Admitting: Emergency Medicine

## 2020-04-24 DIAGNOSIS — Z975 Presence of (intrauterine) contraceptive device: Secondary | ICD-10-CM | POA: Diagnosis not present

## 2020-04-24 DIAGNOSIS — J3489 Other specified disorders of nose and nasal sinuses: Secondary | ICD-10-CM | POA: Diagnosis not present

## 2020-04-24 DIAGNOSIS — Z79899 Other long term (current) drug therapy: Secondary | ICD-10-CM | POA: Diagnosis not present

## 2020-04-24 DIAGNOSIS — R519 Headache, unspecified: Secondary | ICD-10-CM | POA: Diagnosis present

## 2020-04-24 MED ORDER — MELOXICAM 15 MG PO TBDP: 15.0000 mg | ORAL_TABLET | Freq: Every day | ORAL | 0 refills | Status: DC | PRN

## 2020-04-24 MED ORDER — AMOXICILLIN-POT CLAVULANATE 875-125 MG PO TABS: 1.0000 | ORAL_TABLET | Freq: Two times a day (BID) | ORAL | 0 refills | Status: DC

## 2020-04-24 NOTE — Discharge Instructions (Addendum)
You were seen in the emergency department today for facial/sinus pain and abnormal sensation of the neck.   We would like you to stop taking cefdinir and start taking Augmentin as an alternative antibiotic to treat for bacterial sinusitis.  We are sending you home with meloxicam to help with pain.  Meloxicam is a nonsteroidal anti-inflammatory medication that will help with pain and swelling. Be sure to take this medication as prescribed with food, 1 pill every 24 hours,  It should be taken with food, as it can cause stomach upset, and more seriously, stomach bleeding. Do not take other nonsteroidal anti-inflammatory medications with this such as Advil, Motrin, Aleve, Mobic, naproxen, Goodie Powder, or Motrin.    You make take Tylenol per over the counter dosing with these medications.   We have prescribed you new medication(s) today. Discuss the medications prescribed today with your pharmacist as they can have adverse effects and interactions with your other medicines including over the counter and prescribed medications. Seek medical evaluation if you start to experience new or abnormal symptoms after taking one of these medicines, seek care immediately if you start to experience difficulty breathing, feeling of your throat closing, facial swelling, or rash as these could be indications of a more serious allergic reaction  We would like you to follow-up with the ear nose and throat doctor provided in your discharge instructions within the next 3 to 5 days.  Return to the emergency department for any new or worsening symptoms including but not limited to worsening pain, change in your vision, trouble moving your eyes, fever, inability to move your neck, numbness, weakness, passing out, or any other concerns.

## 2020-04-24 NOTE — ED Triage Notes (Signed)
Pt reports sinus pressure for over 1 month has been seeing her pcp for this and waiting to see an allergist. Pt is currently on an antibiotic but states this am was worse facial pain and felt like it was going down into neck and the back of her head.

## 2020-04-24 NOTE — ED Provider Notes (Signed)
[facility] EMERGENCY DEPARTMENT Provider Note   CSN: [id number] Arrival date & time: 04/24/20  1051     History Chief Complaint  Patient presents with  . Facial Pain    Christine Carey is a 34 y.o. female with a history of anxiety who presents to the ED with complaints of sinus pain for the past 1 month.  Patient states she has a constant pain/pressure to the bilateral maxillary and frontal sinuses with associated bilateral ear pressure as well as some postnasal drip.  Symptoms seems to be worse in the morning.  She has been seen by PCP and referred to an allergist who gave her a steroid injection 4 days prior and told her she did not feel improved to start an antibiotic, she was prescribed cefdinir.  She is additionally been utilizing nasal sprays as well as antihistamines such as Claritin or Allegra.  Today is the third day of the antibiotic she states she feels she has not improved prompting ER visit.  She also mentions that over the same course of time she has had some pressure and rush type sensation to her neck that is intermittent, worse with certain positions/movements.  She has had some issues with anxiety/panic attacks as well, taking hydroxyzine without much change. Denies fever, chills, syncope, vomiting, chest discomfort, dyspnea, or cough.  HPI     Past Medical History:  Diagnosis Date  . Anxiety     Patient Active Problem List   Diagnosis Date Noted  . IUD (intrauterine device) in place - May 2018, Mirena 04/23/2018  . Anxiety 04/23/2018    Past Surgical History:  Procedure Laterality Date  . WISDOM TOOTH EXTRACTION       OB History    Gravida  2   Para  2   Term  2   Preterm      AB      Living  2     SAB      TAB      Ectopic      Multiple  0   Live Births  2           Family History  Problem Relation Age of Onset  . Hypertension Mother   . Hyperlipidemia Mother   . Hypertension Father   . Hyperlipidemia Father     . Birth defects Daughter        1 kidney  . Lung cancer Maternal Grandmother   . Stroke Paternal Grandmother   . Stroke Paternal Grandfather   . Healthy Daughter   . Healthy Brother     Social History   Tobacco Use  . Smoking status: Never Smoker  . Smokeless tobacco: Never Used  Substance Use Topics  . Alcohol use: No    Comment: occasionally  . Drug use: No    Home Medications Prior to Admission medications   Medication Sig Start Date End Date Taking? Authorizing Provider  b complex vitamins capsule Take 1 capsule by mouth daily.    [provider]  cholecalciferol (VITAMIN D3) 25 MCG (1000 UNIT) tablet Take 1,000 Units by mouth daily.    [provider]  Fexofenadine HCl (ALLEGRA ALLERGY PO) Take by mouth.    [provider]  levonorgestrel (MIRENA) 20 MCG/24HR IUD 1 each by Intrauterine route once.    [provider]  MAGNESIUM CITRATE PO Take by mouth.    [provider]  MECLIZINE HCL PO Take by mouth.    [provider]  Oxymetazoline HCl (NASAL SPRAY 12 HOUR NA) Place into the nose.    [provider]  Prenatal Multivit-Min-Fe-FA (PRENATAL VITAMINS PO) Take by mouth.    [provider]    Allergies    Patient has no known allergies.  Review of Systems   Review of Systems  Constitutional: Negative for chills and fever.  HENT: Positive for ear pain (Pressure), postnasal drip, sinus pressure and sinus pain. Negative for congestion, rhinorrhea, sore throat, trouble swallowing and voice change.   Respiratory: Negative for cough and shortness of breath.   Gastrointestinal: Negative for abdominal pain and vomiting.  Musculoskeletal: Positive for neck pain.  Neurological: Negative for dizziness and syncope.    Physical Exam Updated Vital Signs BP 127/73 (BP Location: Right Arm)   Pulse 97   Temp 98.3 F (36.8 C) (Oral)   Resp 16   SpO2 100%   Physical Exam Vitals and nursing note  reviewed.  Constitutional:      General: She is not in acute distress.    Appearance: Normal appearance. She is well-developed. She is not toxic-appearing.  HENT:     Head: Normocephalic and atraumatic.     Right Ear: Ear canal normal. Tympanic membrane is not perforated, erythematous, retracted or bulging.     Left Ear: Ear canal normal. Tympanic membrane is not perforated, erythematous, retracted or bulging.     Ears:     Comments: No mastoid erythema/swelling/tenderness.  Mild fullness to bilateral TMs.    Nose: Mucosal edema and congestion present.     Right Sinus: No maxillary sinus tenderness or frontal sinus tenderness.     Left Sinus: No maxillary sinus tenderness or frontal sinus tenderness.     Mouth/Throat:     Pharynx: Oropharynx is clear. Uvula midline. No oropharyngeal exudate or posterior oropharyngeal erythema.     Comments: Posterior oropharynx is symmetric appearing. Patient tolerating own secretions without difficulty. No trismus. No drooling. No hot potato voice. No swelling beneath the tongue, submandibular compartment is soft.  Eyes:     General: Vision grossly intact. Gaze aligned appropriately.        Right eye: No discharge.        Left eye: No discharge.     Extraocular Movements: Extraocular movements intact.     Conjunctiva/sclera: Conjunctivae normal.     Pupils: Pupils are equal, round, and reactive to light.     Comments: No proptosis.   Cardiovascular:     Rate and Rhythm: Normal rate and regular rhythm.     Heart sounds: No murmur.  Pulmonary:     Effort: Pulmonary effort is normal. No respiratory distress.     Breath sounds: Normal breath sounds. No wheezing, rhonchi or rales.  Abdominal:     General: There is no distension.     Palpations: Abdomen is soft.     Tenderness: There is no abdominal tenderness. There is no guarding or rebound.  Musculoskeletal:     Cervical back: Normal range of motion and neck supple. No edema, erythema, rigidity or  crepitus. No pain with movement, spinous process tenderness or muscular tenderness. Normal range of motion.  Lymphadenopathy:     Cervical: No cervical adenopathy.  Skin:    General: Skin is warm and dry.     Findings: No rash.  Neurological:     Mental Status: She is alert.     Comments: Alert. Clear speech. No facial droop. CNIII-XII grossly intact. Bilateral upper and lower extremities' sensation grossly intact. 5/5  symmetric strength with grip strength and with plantar and dorsi flexion bilaterally . Normal finger to nose bilaterally. Negative pronator drift. Gait intact.    Psychiatric:        Mood and Affect: Mood normal.        Behavior: Behavior normal.     ED Results / Procedures / Treatments   Labs (all labs ordered are listed, but only abnormal results are displayed) Labs Reviewed - No data to display  EKG None  Radiology No results found.  Procedures Procedures (including critical care time)  Medications Ordered in ED Medications - No data to display  ED Course  I have reviewed the triage vital signs and the nursing notes.  Pertinent labs & imaging results that were available during my care of the patient were reviewed by me and considered in my medical decision making (see chart for details).    MDM Rules/Calculators/A&P                     Patient presents to the emergency department with complaints of 1 month of sinus pain.  She is nontoxic, resting comfortably, vitals WNL.  She has been on multiple antihistamines and utilizing nasal sprays.  She has received a steroid injection.  Recently started cefdinir without much improvement.  On exam she is overall well-appearing, she has no focal neurologic deficits, no headache red flags.  She is afebrile without nuchal rigidity, therefore low suspicion for meningitis.  She does have some nasal congestion mucosal edema noted.  We discussed options of care as well as further evaluation as there was discussion of  possible CT imaging at one of her outpatient appointments, offered to perform CT maxillofacial in the emergency department, discussed risk/benefits, patient & her husband in agreement with foregoing this at this time.  We will discontinue cefdinir and start patient on Augmentin with meloxicam to help with discomfort.  Plan for ENT follow-up for reevaluation and potential further assessment. I discussed results, treatment plan, need for follow-up, and return precautions with the patient and her husband at bedside. Provided opportunity for questions, patient & her husband confirmed understanding and are in agreement with plan.   Findings and plan of care discussed with supervising physician Dr. Rush Landmark who is in agreement.   Final Clinical Impression(s) / ED Diagnoses Final diagnoses:  Sinus pain    Rx / DC Orders ED Discharge Orders         Ordered    amoxicillin-clavulanate (AUGMENTIN) 875-125 MG tablet  Every 12 hours     04/24/20 1223    Meloxicam 15 MG TBDP  Daily PRN     04/24/20 1223           Cherly Anderson, PA-C 04/24/20 1226    Tegeler, Canary Brim, MD 04/24/20 1610

## 2020-04-26 ENCOUNTER — Emergency Department (HOSPITAL_COMMUNITY)
Admission: EM | Admit: 2020-04-26 | Discharge: 2020-04-26 | Disposition: A | Discharge status: Home / Self Care | Payer: BC Managed Care – PPO | Attending: Emergency Medicine | Admitting: Emergency Medicine

## 2020-04-26 ENCOUNTER — Telehealth: Payer: Self-pay

## 2020-04-26 ENCOUNTER — Other Ambulatory Visit: Payer: Self-pay

## 2020-04-26 ENCOUNTER — Encounter (HOSPITAL_COMMUNITY): Payer: Self-pay | Admitting: Emergency Medicine

## 2020-04-26 DIAGNOSIS — R112 Nausea with vomiting, unspecified: Secondary | ICD-10-CM | POA: Diagnosis not present

## 2020-04-26 DIAGNOSIS — R0989 Other specified symptoms and signs involving the circulatory and respiratory systems: Secondary | ICD-10-CM | POA: Insufficient documentation

## 2020-04-26 DIAGNOSIS — R519 Headache, unspecified: Secondary | ICD-10-CM | POA: Insufficient documentation

## 2020-04-26 DIAGNOSIS — Z79899 Other long term (current) drug therapy: Secondary | ICD-10-CM | POA: Diagnosis not present

## 2020-04-26 LAB — COMPREHENSIVE METABOLIC PANEL
ALT: 13 U/L (ref 0–44)
AST: 19 U/L (ref 15–41)
Albumin: 4.7 g/dL (ref 3.5–5.0)
Alkaline Phosphatase: 33 U/L — ABNORMAL LOW (ref 38–126)
Anion gap: 13 (ref 5–15)
BUN: 7 mg/dL (ref 6–20)
CO2: 21 mmol/L — ABNORMAL LOW (ref 22–32)
Calcium: 9.4 mg/dL (ref 8.9–10.3)
Chloride: 105 mmol/L (ref 98–111)
Creatinine, Ser: 0.82 mg/dL (ref 0.44–1.00)
GFR calc Af Amer: >60 mL/min (ref >60)
GFR calc non Af Amer: >60 mL/min (ref >60)
Glucose, Bld: 119 mg/dL — ABNORMAL HIGH (ref 70–99)
Potassium: 3.2 mmol/L — ABNORMAL LOW (ref 3.5–5.1)
Sodium: 139 mmol/L (ref 135–145)
Total Bilirubin: 0.9 mg/dL (ref 0.3–1.2)
Total Protein: 7.6 g/dL (ref 6.5–8.1)

## 2020-04-26 LAB — CBC WITH DIFFERENTIAL/PLATELET
Abs Immature Granulocytes: 0.03 10*3/uL (ref 0.00–0.07)
Basophils Absolute: 0.1 10*3/uL (ref 0.0–0.1)
Basophils Relative: 1 %
Eosinophils Absolute: 0 10*3/uL (ref 0.0–0.5)
Eosinophils Relative: 1 %
HCT: 42.4 % (ref 36.0–46.0)
Hemoglobin: 13.9 g/dL (ref 12.0–15.0)
Immature Granulocytes: 0 %
Lymphocytes Relative: 13 %
Lymphs Abs: 1.2 10*3/uL (ref 0.7–4.0)
MCH: 30.3 pg (ref 26.0–34.0)
MCHC: 32.8 g/dL (ref 30.0–36.0)
MCV: 92.6 fL (ref 80.0–100.0)
Monocytes Absolute: 0.7 10*3/uL (ref 0.1–1.0)
Monocytes Relative: 8 %
Neutro Abs: 6.8 10*3/uL (ref 1.7–7.7)
Neutrophils Relative %: 77 %
Platelets: 414 10*3/uL — ABNORMAL HIGH (ref 150–400)
RBC: 4.58 MIL/uL (ref 3.87–5.11)
RDW: 12.5 % (ref 11.5–15.5)
WBC: 8.9 10*3/uL (ref 4.0–10.5)
nRBC: 0 % (ref 0.0–0.2)

## 2020-04-26 MED ORDER — PROMETHAZINE HCL 25 MG PO TABS
25.0000 mg | ORAL_TABLET | Freq: Once | ORAL | Status: AC
  Administered 2020-04-26: 25 mg via ORAL
  Filled 2020-04-26: qty 1

## 2020-04-26 MED ORDER — SALINE SPRAY 0.65 % NA SOLN
1.0000 | NASAL | 0 refills | Status: DC | PRN
Start: 2020-04-26 — End: 2020-05-11

## 2020-04-26 MED ORDER — FLUTICASONE PROPIONATE 50 MCG/ACT NA SUSP
1.0000 | Freq: Two times a day (BID) | NASAL | 0 refills | Status: DC
Start: 2020-04-26 — End: 2020-05-11

## 2020-04-26 MED ORDER — LORAZEPAM 1 MG PO TABS
1.0000 mg | ORAL_TABLET | Freq: Three times a day (TID) | ORAL | 0 refills | Status: DC | PRN
Start: 2020-04-26 — End: 2020-05-11

## 2020-04-26 MED ORDER — LORAZEPAM 1 MG PO TABS
1.0000 mg | ORAL_TABLET | Freq: Once | ORAL | Status: AC
  Administered 2020-04-26: 1 mg via ORAL
  Filled 2020-04-26: qty 1

## 2020-04-26 MED ORDER — PROMETHAZINE HCL 25 MG PO TABS
25.0000 mg | ORAL_TABLET | Freq: Four times a day (QID) | ORAL | 0 refills | Status: DC | PRN
Start: 2020-04-26 — End: 2020-05-09

## 2020-04-26 NOTE — ED Triage Notes (Addendum)
Pt reports ongoing sinus pressure that she has seen her allergist for, been seen here in ED for (Sunday) and continues to have worsening facial pain and nausea, states she was seen at urgent care and was given nausea meds that are not working. Pt currently on amoxicillin. Endorses low grade fever at home.

## 2020-04-26 NOTE — Discharge Instructions (Signed)
Please take the medications prescribed. Complete the course of antibiotics. Dr. [facility] will contact you for an appointment.  If you do not hear from them by noon tomorrow, please call and ensure that you are seen in 7 to 10 days.  Return to the ER if your symptoms get worse.

## 2020-04-26 NOTE — Telephone Encounter (Signed)
Incoming call from father, wondering about phenergan prescription. Just then this case manager door opened and someone came in with that very prescription Will be called into [facility] on [address] per fathers direction.Marland Kitchen

## 2020-04-26 NOTE — ED Provider Notes (Signed)
[facility] EMERGENCY DEPARTMENT Provider Note   CSN: [id number] Arrival date & time: 04/26/20  0818     History Chief Complaint  Patient presents with  . Nausea    Lam Bjorklund is a 34 y.o. female.  HPI     34 year old female with history of anxiety comes in a chief complaint of facial pain and headaches.  Patient reports that over the last month she has been having some discomfort over her face.  Patient has seen her PCP, allergist, urgent care and ER for it.  Over the course of the last month she has been given steroid shot, nasal sprays, anti-inflammatory medications and more recently started on antibiotics.  Despite taking the medications her symptoms have not improved.  Given that there is some anxiety history, her anxiety has gotten worse as her symptoms have not improved.  Patient's headache is described as frontal headache with pressure around the sinus region.  She is also having nausea and dry heaving.  Symptoms are worse in the morning, but persist throughout the day.  Occasionally she will have some vertiginous symptoms and pain in the back of her head, which are self-limiting symptoms.  She has no neck pain or stiffness.  Patient reports low-grade temperature with temp of <100.  There has been no nasal drainage or sinus congestion.  The nausea started once she started taking the antibiotics more recently.  Past Medical History:  Diagnosis Date  . Anxiety     Patient Active Problem List   Diagnosis Date Noted  . IUD (intrauterine device) in place - May 2018, Mirena 04/23/2018  . Anxiety 04/23/2018    Past Surgical History:  Procedure Laterality Date  . WISDOM TOOTH EXTRACTION       OB History    Gravida  2   Para  2   Term  2   Preterm      AB      Living  2     SAB      TAB      Ectopic      Multiple  0   Live Births  2           Family History  Problem Relation Age of Onset  . Hypertension Mother   .  Hyperlipidemia Mother   . Hypertension Father   . Hyperlipidemia Father   . Birth defects Daughter        1 kidney  . Lung cancer Maternal Grandmother   . Stroke Paternal Grandmother   . Stroke Paternal Grandfather   . Healthy Daughter   . Healthy Brother     Social History   Tobacco Use  . Smoking status: Never Smoker  . Smokeless tobacco: Never Used  Substance Use Topics  . Alcohol use: No    Comment: occasionally  . Drug use: No    Home Medications Prior to Admission medications   Medication Sig Start Date End Date Taking? Authorizing Provider  amoxicillin-clavulanate (AUGMENTIN) 875-125 MG tablet Take 1 tablet by mouth every 12 (twelve) hours. 04/24/20   Petrucelli, Samantha R, PA-C  b complex vitamins capsule Take 1 capsule by mouth daily.    [provider]  cholecalciferol (VITAMIN D3) 25 MCG (1000 UNIT) tablet Take 1,000 Units by mouth daily.    [provider]  Fexofenadine HCl (ALLEGRA ALLERGY PO) Take by mouth.    [provider]  fluticasone (FLONASE) 50 MCG/ACT nasal spray Place 1 spray into both nostrils in the  morning and at bedtime for 7 days. 04/26/20 05/03/20  Derwood Kaplan, MD  levonorgestrel (MIRENA) 20 MCG/24HR IUD 1 each by Intrauterine route once.    [provider]  LORazepam (ATIVAN) 1 MG tablet Take 1 tablet (1 mg total) by mouth 3 (three) times daily as needed for anxiety. 04/26/20   Derwood Kaplan, MD  MAGNESIUM CITRATE PO Take by mouth.    [provider]  MECLIZINE HCL PO Take by mouth.    [provider]  Meloxicam 15 MG TBDP Take 15 mg by mouth daily as needed (pain). 04/24/20   Petrucelli, Samantha R, PA-C  Oxymetazoline HCl (NASAL SPRAY 12 HOUR NA) Place into the nose.    [provider]  Prenatal Multivit-Min-Fe-FA (PRENATAL VITAMINS PO) Take by mouth.    [provider]  promethazine (PHENERGAN) 25 MG tablet Take 1 tablet (25 mg total) by mouth every 6 (six) hours as needed for  nausea or vomiting. 04/26/20   Derwood Kaplan, MD  sodium chloride (OCEAN) 0.65 % SOLN nasal spray Place 1 spray into both nostrils as needed for congestion. 04/26/20   Derwood Kaplan, MD    Allergies    Patient has no known allergies.  Review of Systems   Review of Systems  Constitutional: Positive for activity change.  HENT: Positive for sinus pressure and sinus pain. Negative for rhinorrhea and sore throat.   Respiratory: Negative for cough, shortness of breath and wheezing.   Allergic/Immunologic: Negative for immunocompromised state.    Physical Exam Updated Vital Signs BP 94/62   Pulse 75   Temp 98.7 F (37.1 C) (Oral)   Resp 16   SpO2 100%   Physical Exam Vitals and nursing note reviewed.  Constitutional:      General: She is not in acute distress.    Appearance: She is well-developed. She is not ill-appearing or toxic-appearing.  HENT:     Head: Normocephalic and atraumatic.     Right Ear: Tympanic membrane normal.     Left Ear: Tympanic membrane normal.     Nose: No congestion or rhinorrhea.  Eyes:     Extraocular Movements: Extraocular movements intact.     Conjunctiva/sclera: Conjunctivae normal.     Pupils: Pupils are equal, round, and reactive to light.     Comments: No ophthalmoplegia  Cardiovascular:     Rate and Rhythm: Normal rate.  Pulmonary:     Effort: Pulmonary effort is normal.  Musculoskeletal:     Cervical back: Normal range of motion and neck supple.  Lymphadenopathy:     Cervical: No cervical adenopathy.  Skin:    General: Skin is warm and dry.  Neurological:     Mental Status: She is alert and oriented to person, place, and time.     ED Results / Procedures / Treatments   Labs (all labs ordered are listed, but only abnormal results are displayed) Labs Reviewed  COMPREHENSIVE METABOLIC PANEL - Abnormal; Notable for the following components:      Result Value   Potassium 3.2 (*)    CO2 21 (*)    Glucose, Bld 119 (*)    Alkaline  Phosphatase 33 (*)    All other components within normal limits  CBC WITH DIFFERENTIAL/PLATELET - Abnormal; Notable for the following components:   Platelets 414 (*)    All other components within normal limits    EKG None  Radiology No results found.  Procedures Procedures (including critical care time)  Medications Ordered in ED Medications  LORazepam (ATIVAN) tablet 1 mg (1 mg Oral Given 04/26/20 1002)  promethazine (PHENERGAN) tablet 25 mg (25 mg Oral Given 04/26/20 1002)    ED Course  I have reviewed the triage vital signs and the nursing notes.  Pertinent labs & imaging results that were available during my care of the patient were reviewed by me and considered in my medical decision making (see chart for details).  Clinical Course as of Apr 26 1124  Tue Apr 26, 2020  1123 Results of the ER work-up discussed.  Overall, labs are reassuring.  There is mild elevated platelets, which is nonspecific.  I spoke with Dr. Wilburn Cornelia, ENT.  They will get the patient in to the clinic sometime next week.  He recommends that patient get Flonase and that she finishes her antibiotic course prior to their visit.  These recommendations have been discussed with the patient.  She also felt better with the oral Ativan and promethazine given here, will prescribe her dose medications as well.  At this time, without any red flags for elevated ICP and without true headaches -we will not proceed with CT scan looking for dural thrombosis or venous sinus thrombosis.  If her symptoms were to progress, then it could be considered.   [AN]    Clinical Course User Index [AN] Varney Biles, MD   MDM Rules/Calculators/A&P                      34 year old female comes in a chief complaint of headaches, sinus pressure and anxiety.  She has been suffering with discomfort over her face/sinus region over the last month.  She has been seen by multiple clinicians and is currently on antibiotics.  However, her  symptoms have not improved.  She is having nausea with the antibiotic prescription, and she is concerned that there could be something else going on.  Patient is nontoxic.  Hemodynamically she is stable.  No meningismus. Given the duration of the symptoms and the progression of it, we considered infectious process, however patient is already on antibiotics.  Clinically, hypersensitivity is low in the differential as she really has no rhinorrhea and does not sound congested.  Given that she is immunocompetent, chances of deep space infection is extremely low.  History is not suggestive of elevated ICP and we do not think there is underlying tumor.  I considered dural thrombosis and venous thrombosis in the differential along with structural issues in her sinuses or airway, such as polyps.  Both patient and her husband are understanding.  We have agreed for now to get basic labs.  If the white count is significantly elevated, then we will proceed with CT scan of the sinuses -otherwise they are happy with medical management.  What I think would be beneficial is for patient to get the care team that involves PCP and ENT, as they can look at the root cause of her symptoms and also manage the underlying anxiety.  For now we will focus on symptom control.  Final Clinical Impression(s) / ED Diagnoses Final diagnoses:  Headache in front of head  Sinus complaint    Rx / DC Orders ED Discharge Orders         Ordered    sodium chloride (OCEAN) 0.65 % SOLN nasal spray  As needed     04/26/20 1112    fluticasone (FLONASE) 50 MCG/ACT nasal spray  2 times daily     04/26/20 1112    LORazepam (ATIVAN) 1  MG tablet  3 times daily PRN     04/26/20 1113    promethazine (PHENERGAN) 25 MG tablet  Every 6 hours PRN     04/26/20 1120           Derwood Kaplan, MD 04/26/20 1125

## 2020-05-04 ENCOUNTER — Other Ambulatory Visit: Payer: Self-pay

## 2020-05-04 ENCOUNTER — Encounter: Payer: Self-pay | Admitting: Family Medicine

## 2020-05-04 ENCOUNTER — Telehealth (INDEPENDENT_AMBULATORY_CARE_PROVIDER_SITE_OTHER): Payer: BC Managed Care – PPO | Admitting: Family Medicine

## 2020-05-04 DIAGNOSIS — J018 Other acute sinusitis: Secondary | ICD-10-CM | POA: Diagnosis not present

## 2020-05-04 DIAGNOSIS — F410 Panic disorder [episodic paroxysmal anxiety] without agoraphobia: Secondary | ICD-10-CM

## 2020-05-04 DIAGNOSIS — F411 Generalized anxiety disorder: Secondary | ICD-10-CM

## 2020-05-04 DIAGNOSIS — H9311 Tinnitus, right ear: Secondary | ICD-10-CM

## 2020-05-04 MED ORDER — ALPRAZOLAM 0.5 MG PO TABS
0.5000 mg | ORAL_TABLET | Freq: Every day | ORAL | 0 refills | Status: AC | PRN
Start: 2020-05-04 — End: ?

## 2020-05-04 MED ORDER — ESCITALOPRAM OXALATE 10 MG PO TABS: 10.0000 mg | ORAL_TABLET | Freq: Every day | ORAL | 2 refills | Status: DC

## 2020-05-04 NOTE — Progress Notes (Signed)
Virtual Visit via Video Note  Subjective  CC:  Chief Complaint  Patient presents with  . Sinus Problem    seen in ED 5/2 and 5/4, recommended est. with ENT,no imaging performed, ENT prescribed Valium for vertigo  . Anxiety    anxiety attacks nightly, recently prescribed Ativan at ER - was taking before bed.      I connected with Christine Carey on 05/04/20 at 10:30 AM EDT by a video enabled telemedicine application and verified that I am speaking with the correct person using two identifiers. Location patient: Home Location provider: [facility] [address] Office Persons participating in the virtual visit: Christine Carey, Christine Ora, MD Adela Glimpse CMA  I discussed the limitations of evaluation and management by telemedicine and the availability of in person appointments. The patient expressed understanding and agreed to proceed. HPI: Blia Carey is a 34 y.o. female who was contacted today to address the problems listed above in the chief complaint. . Sinus pressure: reviewed notes from ENT and ER . Anxiety: increased worry over health. Panic attacks surrounding concerns for brain tumor over her current sinus related sxs and nausea from abx. Mom has anxiety d/o. Poor sleep. Constant worry. Emotional lability are present. Mild anxiety 1st surfaced in 2019. Now "next level"  Depression screen [facility] 2/9 05/04/2020 04/23/2018  Decreased Interest 1 0  Down, Depressed, Hopeless 3 0  PHQ - 2 Score 4 0  Altered sleeping 3 0  Tired, decreased energy 1 0  Change in appetite 3 0  Feeling bad or failure about yourself  1 0  Trouble concentrating 1 0  Moving slowly or fidgety/restless 0 0  Suicidal thoughts 0 0  PHQ-9 Score 13 0  Difficult doing work/chores Very difficult Not difficult at all   GAD 7 : Generalized Anxiety Score 05/04/2020 04/23/2018  Nervous, Anxious, on Edge 3 1  Control/stop worrying 3 1  Worry too much - different things 3 2  Trouble relaxing 1 1    Restless 1 0  Easily annoyed or irritable 1 1  Afraid - awful might happen 3 2  Total GAD 7 Score 15 8  Anxiety Difficulty Very difficult Somewhat difficult     Assessment  1. GAD (generalized anxiety disorder)   2. Panic attack   3. Other subacute sinusitis   4. Tinnitus, right      Plan   GAD and panic:  Discussed need to further work with therapist on phobia over her health. Start xanax and lexapro. Education on appropriate expectations discussed in detail. Reassured.  I discussed the assessment and treatment plan with the patient. The patient was provided an opportunity to ask questions and all were answered. The patient agreed with the plan and demonstrated an understanding of the instructions.   The patient was advised to call back or seek an in-person evaluation if the symptoms worsen or if the condition fails to improve as anticipated. Follow up: 6 weeks for recheck   06/24/2020  Meds ordered this encounter  Medications  . escitalopram (LEXAPRO) 10 MG tablet    Sig: Take 1 tablet (10 mg total) by mouth daily.    Dispense:  30 tablet    Refill:  2  . ALPRAZolam (XANAX) 0.5 MG tablet    Sig: Take 1 tablet (0.5 mg total) by mouth daily as needed for anxiety.    Dispense:  30 tablet    Refill:  0      I  reviewed the patients updated PMH, FH, and SocHx.    Patient Active Problem List   Diagnosis Date Noted  . IUD (intrauterine device) in place - May 2018, Mirena 04/23/2018  . GAD (generalized anxiety disorder) 04/23/2018   Current Meds  Medication Sig  . b complex vitamins capsule Take 1 capsule by mouth daily.  . cholecalciferol (VITAMIN D3) 25 MCG (1000 UNIT) tablet Take 1,000 Units by mouth daily.  Marland Kitchen Fexofenadine HCl (ALLEGRA ALLERGY PO) Take by mouth.  . levonorgestrel (MIRENA) 20 MCG/24HR IUD 1 each by Intrauterine route once.  Marland Kitchen LORazepam (ATIVAN) 1 MG tablet Take 1 tablet (1 mg total) by mouth 3 (three) times daily as needed for anxiety.  . MECLIZINE HCL  PO Take by mouth.  . Oxymetazoline HCl (NASAL SPRAY 12 HOUR NA) Place into the nose.  . Prenatal Multivit-Min-Fe-FA (PRENATAL VITAMINS PO) Take by mouth.  . promethazine (PHENERGAN) 25 MG tablet Take 1 tablet (25 mg total) by mouth every 6 (six) hours as needed for nausea or vomiting.  . sodium chloride (OCEAN) 0.65 % SOLN nasal spray Place 1 spray into both nostrils as needed for congestion.    Allergies: Patient has No Known Allergies. Family History: Patient family history includes Birth defects in her daughter; Healthy in her brother and daughter; Hyperlipidemia in her father and mother; Hypertension in her father and mother; Lung cancer in her maternal grandmother; Stroke in her paternal grandfather and paternal grandmother. Social History:  Patient  reports that she has never smoked. She has never used smokeless tobacco. She reports that she does not drink alcohol or use drugs.  Review of Systems: Constitutional: Negative for fever malaise or anorexia Cardiovascular: negative for chest pain Respiratory: negative for SOB or persistent cough Gastrointestinal: negative for abdominal pain  OBJECTIVE Vitals: There were no vitals taken for this visit. General: no acute distress , A&Ox3 Psych: anxious, tearful. Worried. Poor insight.   Leamon Arnt, MD

## 2020-05-05 ENCOUNTER — Telehealth: Payer: Self-pay | Admitting: Family Medicine

## 2020-05-05 NOTE — Telephone Encounter (Signed)
Patient is calling in asking what she should do as her new anxiety medication -escitalopram (LEXAPRO) 10 MG tablet- has been making her feel nauseated.

## 2020-05-06 ENCOUNTER — Telehealth (INDEPENDENT_AMBULATORY_CARE_PROVIDER_SITE_OTHER): Payer: BC Managed Care – PPO | Admitting: Internal Medicine

## 2020-05-06 ENCOUNTER — Telehealth: Payer: Self-pay | Admitting: Family Medicine

## 2020-05-06 ENCOUNTER — Other Ambulatory Visit (INDEPENDENT_AMBULATORY_CARE_PROVIDER_SITE_OTHER): Payer: BC Managed Care – PPO

## 2020-05-06 ENCOUNTER — Encounter: Payer: Self-pay | Admitting: Internal Medicine

## 2020-05-06 ENCOUNTER — Other Ambulatory Visit: Payer: Self-pay

## 2020-05-06 ENCOUNTER — Ambulatory Visit
Admission: RE | Admit: 2020-05-06 | Discharge: 2020-05-06 | Disposition: A | Discharge status: Home / Self Care | Payer: BC Managed Care – PPO | Source: Ambulatory Visit | Attending: Internal Medicine | Admitting: Internal Medicine

## 2020-05-06 VITALS — Temp 98.5°F | Ht 60.0 in | Wt 135.0 lb

## 2020-05-06 DIAGNOSIS — R112 Nausea with vomiting, unspecified: Secondary | ICD-10-CM

## 2020-05-06 DIAGNOSIS — R519 Headache, unspecified: Secondary | ICD-10-CM

## 2020-05-06 LAB — BASIC METABOLIC PANEL
BUN: 6 mg/dL (ref 6–23)
CO2: 27 mEq/L (ref 19–32)
Calcium: 9.2 mg/dL (ref 8.4–10.5)
Chloride: 102 mEq/L (ref 96–112)
Creatinine, Ser: 0.71 mg/dL (ref 0.40–1.20)
GFR: 94.77 mL/min (ref >60.00)
Glucose, Bld: 183 mg/dL — ABNORMAL HIGH (ref 70–99)
Potassium: 3.3 mEq/L — ABNORMAL LOW (ref 3.5–5.1)
Sodium: 138 mEq/L (ref 135–145)

## 2020-05-06 LAB — SEDIMENTATION RATE: Sed Rate: 17 mm/hr (ref 0–20)

## 2020-05-06 LAB — HCG, SERUM, QUALITATIVE: Preg, Serum: NEGATIVE

## 2020-05-06 IMAGING — CT CT HEAD W/O CM
1 series · 16 of 30 positions shown, 20 images · non-contrast
Comparison: No pertinent prior studies available for comparison.

CLINICAL DATA: Non intractable vomiting with nausea, unspecified
vomiting. Pressure in head. Additional history provided: Vomiting
and headache, unrelieved by supportive meds.

EXAM:
CT HEAD WITHOUT CONTRAST
TECHNIQUE: Contiguous axial images were obtained from the base of the skull
through the vertex without intravenous contrast.

[Series 2: head w/(date) · axial · 0.40mm/px · z∈[-167,-32]mm · 16 of 31 slices shown, 20 images]
[im 2/31  brain]
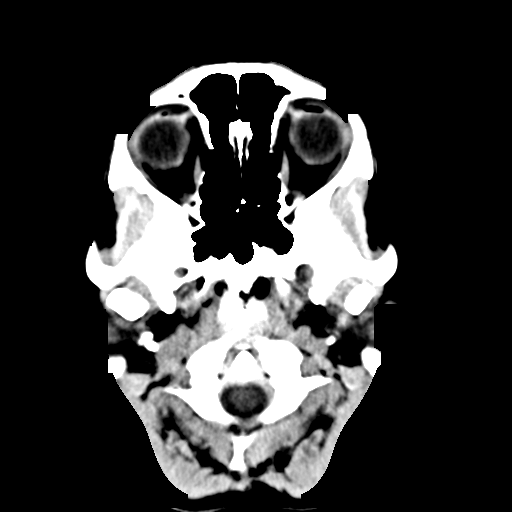
[im 2/31  bone]
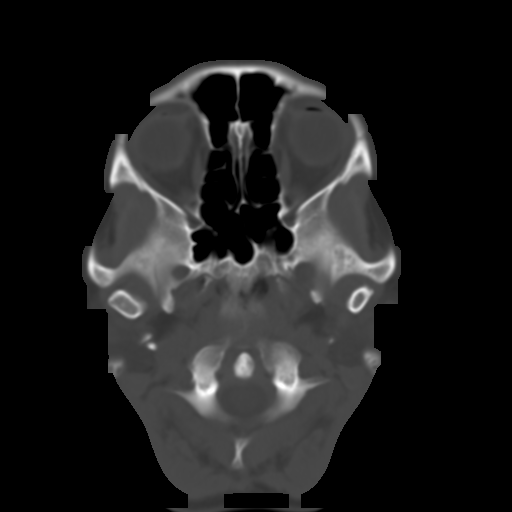
[im 4/31  brain]
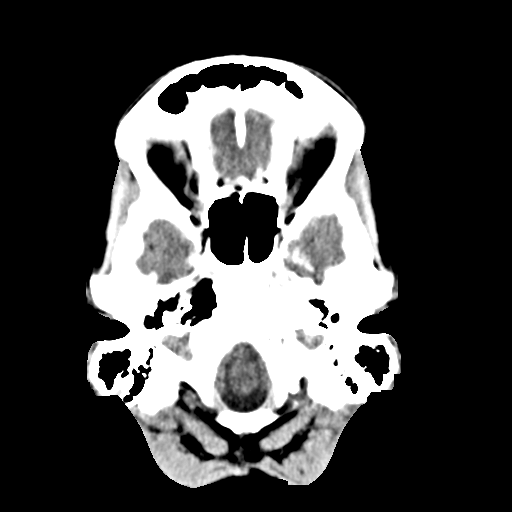
[im 6/31  brain]
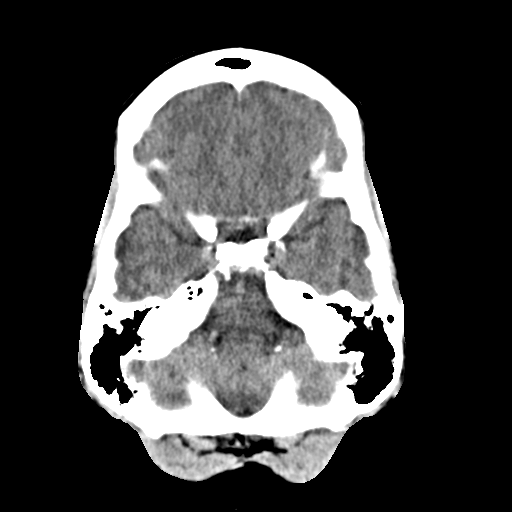
[im 8/31  brain]
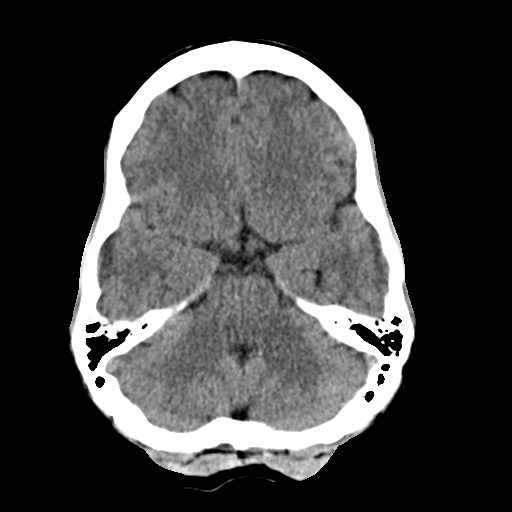
[im 9/31  brain]
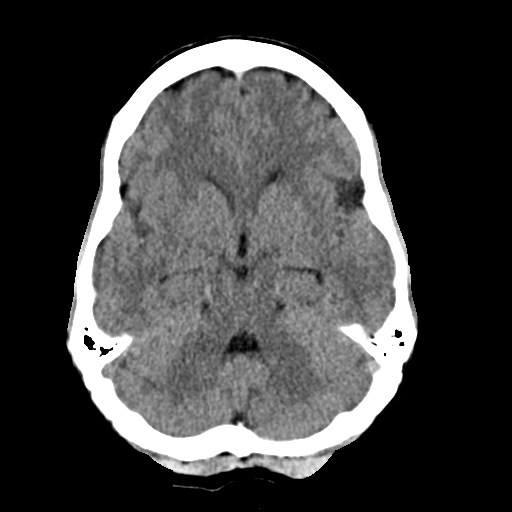
[im 9/31  bone]
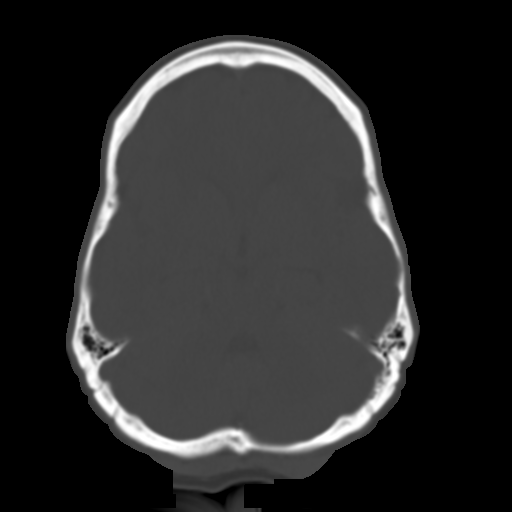
[im 11/31  brain]
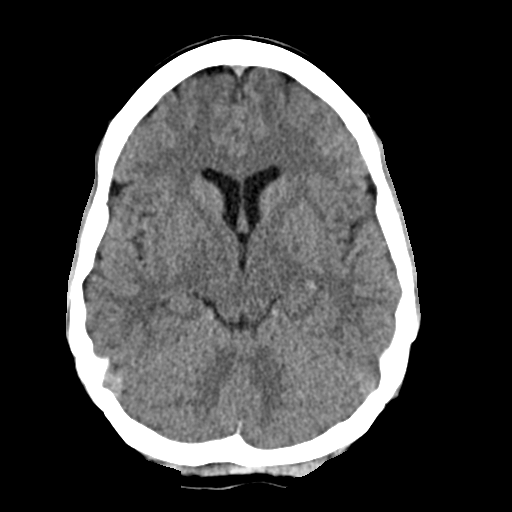
[im 13/31  brain]
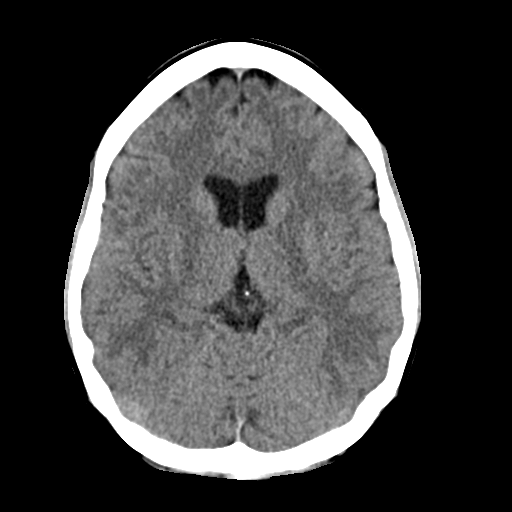
[im 15/31  brain]
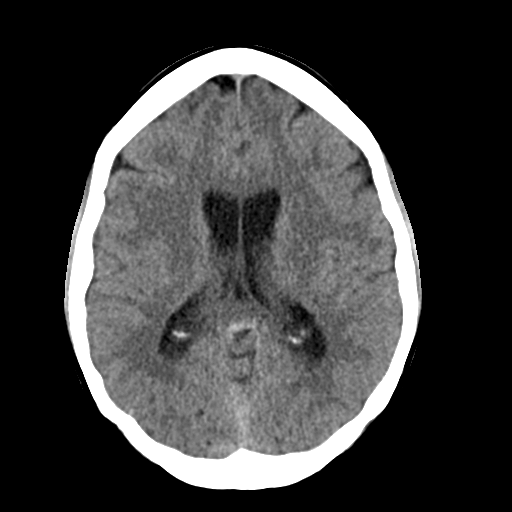
[im 16/31  brain]
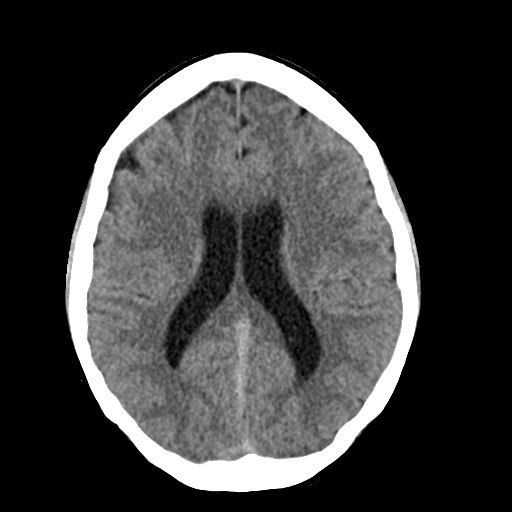
[im 16/31  bone]
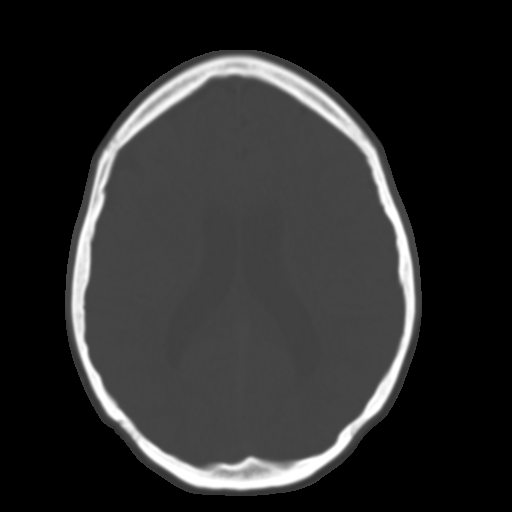
[im 18/31  brain]
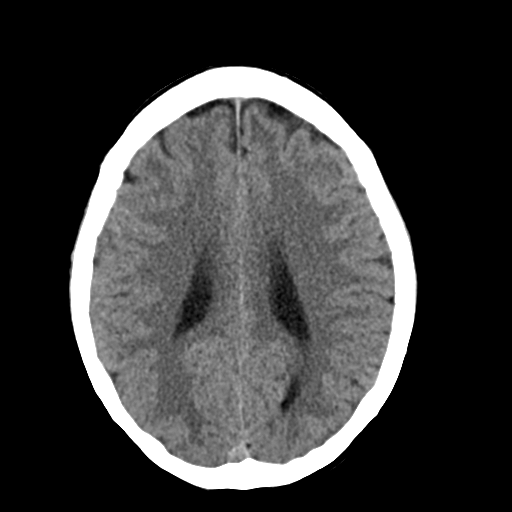
[im 20/31  brain]
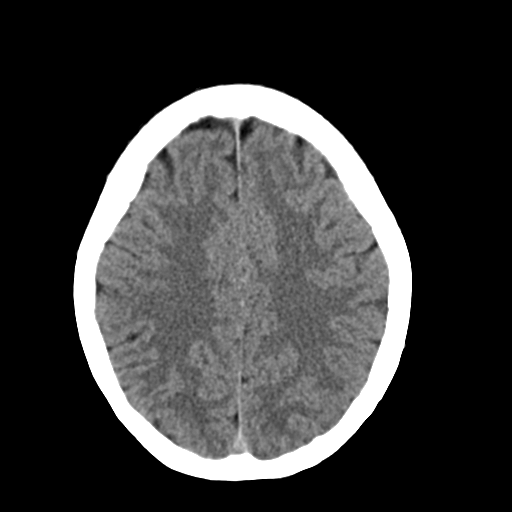
[im 22/31  brain]
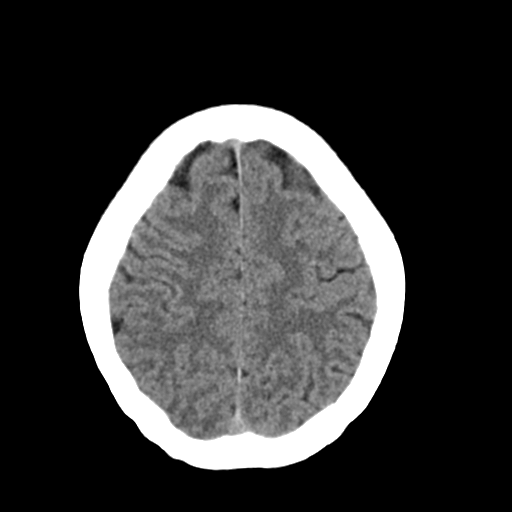
[im 23/31  brain]
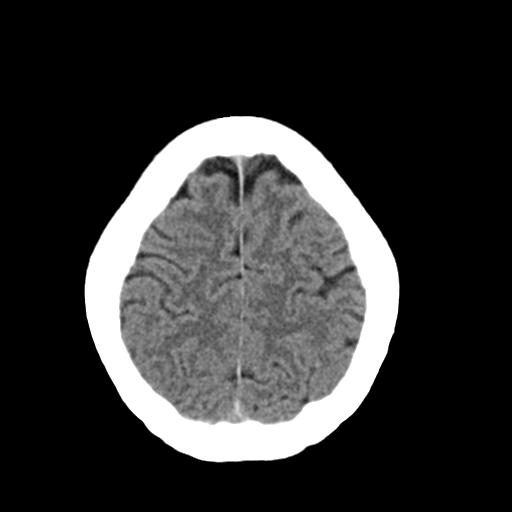
[im 23/31  bone]
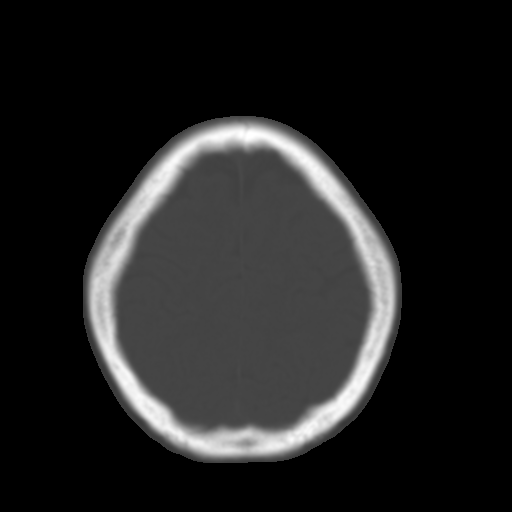
[im 25/31  brain]
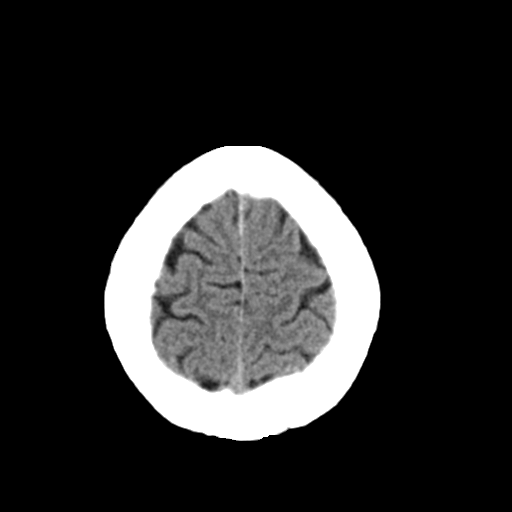
[im 27/31  brain]
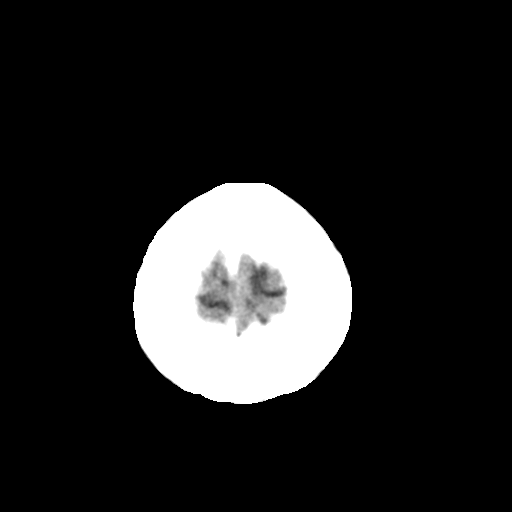
[im 29/31  brain]
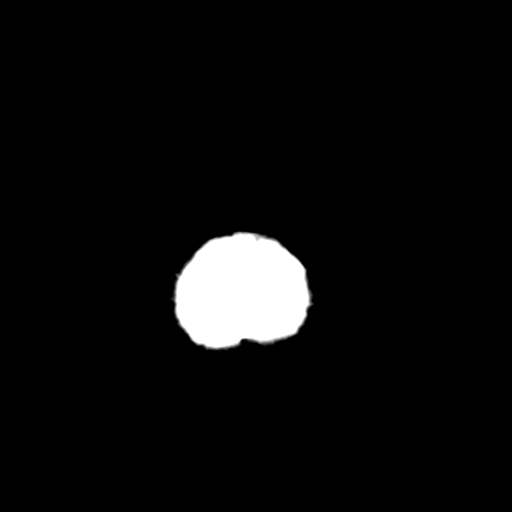

[16 of 30 positions shown; findings below may reference images not displayed]

FINDINGS: Brain:

There is no acute intracranial hemorrhage.

No demarcated cortical infarct.

No extra-axial fluid collection.

No evidence of intracranial mass.

No midline shift.

Vascular: No hyperdense vessel.

Skull: Normal. Negative for fracture or focal lesion.

Sinuses/Orbits: Visualized orbits show no acute finding. No
significant paranasal sinus disease or mastoid effusion at the
imaged levels.
IMPRESSION: Unremarkable CT appearance of the brain. No evidence of acute
intracranial abnormality.

## 2020-05-06 NOTE — Progress Notes (Signed)
Head scan shows no lesion and sinuses seem ok   (  one could still have sinus inflammation  and not see on this view but   not enough to cause you the symptoms you are having .   Work on getting potassium level back up (see other result  notes )   and a follow up with your pcp

## 2020-05-06 NOTE — Progress Notes (Signed)
So potassium still somewhat low that could make you feel weaker .  Sugar level is up but that could be from having to drink sugar drinks  Please change to higher potassium fluids ( and foods) such as pedialyte , vegetable juices  Than have more potassium and less sugar in them .  the ct of the head is normal and sinuses look normal  doesn't explain everything but rules out a lot of other causes of headache and  nausea  this is good news  otherwise   need follow  up  visit with your PCP next week

## 2020-05-06 NOTE — Progress Notes (Signed)
Virtual Visit via Video Note  I connected with@ on 05/06/20 at  9:30 AM EDT by a video enabled telemedicine application and verified that I am speaking with the correct person using two identifiers. Location patient: home Location provider:work  office Persons participating in the virtual visit: patient, provider  [place] national recommendations  regarding COVID 19 pandemic   video visit is advised over in office visit for this patient.  Patient aware  of the limitations of evaluation and management by telemedicine and  availability of in person appointments. and agreed to proceed.   HPI: Christine Carey presents for video visit SDA  PCP NA in triage advised to be seen within 4 hours. She has been evaluated for some ongoing nausea with occasional vomiting or regurgitation over the last month or so and what she describes as head pressures in the frontal and facial area and occasionally on the top of her head.  She was seen in the emergency room beginning of May treated for sinusitis with Augmentin has also been noticed to have anxiety contributing possibly to some of her symptoms.  Was given lorazepam to be able to sleep at night medicine completed and given Lexapro and Xanax most recently to try for the anxiety. She was only on the Lexapro couple days decided to stop it when her aggravated symptoms occurred temporarily.  She never took the Xanax. She has an IUD and states that her pregnancy test was negative. She has seen ENT who felt her sinuses looked okay but she was on the antibiotic and attributed her nausea to that Is also seen an allergist and audiologic evaluation to rule out Mnire's and her hearing apparently is normal.  She tested negative for Covid more recently at an urgent care her children are well except one has a sore throat.  She has no fever.  No one in the family has had Covid and she has been immunized.  Last immunization 04/09/2020 [place]   Today she feels worse although was  able to keep some oatmeal down her urine is not super concentrated is using water and Gatorade.  But does not feel well. No diarrhea Phenergan helps a bit Zofran did not help. There is no major vision changes or neurologic changes She states her head pressure is worse when she is laying flat.   ROS: See pertinent positives and negatives per HPI.  Past Medical History:  Diagnosis Date  . Anxiety     Past Surgical History:  Procedure Laterality Date  . WISDOM TOOTH EXTRACTION      Family History  Problem Relation Age of Onset  . Hypertension Mother   . Hyperlipidemia Mother   . Hypertension Father   . Hyperlipidemia Father   . Birth defects Daughter        1 kidney  . Lung cancer Maternal Grandmother   . Stroke Paternal Grandmother   . Stroke Paternal Grandfather   . Healthy Daughter   . Healthy Brother     Social History   Tobacco Use  . Smoking status: Never Smoker  . Smokeless tobacco: Never Used  Substance Use Topics  . Alcohol use: No    Comment: occasionally  . Drug use: No      Current Outpatient Medications:  .  ALPRAZolam (XANAX) 0.5 MG tablet, Take 1 tablet (0.5 mg total) by mouth daily as needed for anxiety., Disp: 30 tablet, Rfl: 0 .  b complex vitamins capsule, Take 1 capsule by mouth daily., Disp: , Rfl:  .  cholecalciferol (VITAMIN D3) 25 MCG (1000 UNIT) tablet, Take 1,000 Units by mouth daily., Disp: , Rfl:  .  escitalopram (LEXAPRO) 10 MG tablet, Take 1 tablet (10 mg total) by mouth daily., Disp: 30 tablet, Rfl: 2 .  Fexofenadine HCl (ALLEGRA ALLERGY PO), Take by mouth., Disp: , Rfl:  .  levonorgestrel (MIRENA) 20 MCG/24HR IUD, 1 each by Intrauterine route once., Disp: , Rfl:  .  LORazepam (ATIVAN) 1 MG tablet, Take 1 tablet (1 mg total) by mouth 3 (three) times daily as needed for anxiety., Disp: 9 tablet, Rfl: 0 .  MECLIZINE HCL PO, Take by mouth., Disp: , Rfl:  .  Oxymetazoline HCl (NASAL SPRAY 12 HOUR NA), Place into the nose., Disp: , Rfl:   .  Prenatal Multivit-Min-Fe-FA (PRENATAL VITAMINS PO), Take by mouth., Disp: , Rfl:  .  promethazine (PHENERGAN) 25 MG tablet, Take 1 tablet (25 mg total) by mouth every 6 (six) hours as needed for nausea or vomiting., Disp: 20 tablet, Rfl: 0 .  sodium chloride (OCEAN) 0.65 % SOLN nasal spray, Place 1 spray into both nostrils as needed for congestion., Disp: 15 mL, Rfl: 0 .  fluticasone (FLONASE) 50 MCG/ACT nasal spray, Place 1 spray into both nostrils in the morning and at bedtime for 7 days., Disp: 9.9 mL, Rfl: 0  EXAM: BP Readings from Last 3 Encounters:  04/26/20 94/62  04/24/20 127/73  04/13/20 118/70    VITALS per patient if applicable: Looks tired but cognitively intact no cough or nasal congestion now has 2 young children.  In no acute distress and nontoxic  GENERAL: alert, oriented, appears well and in no acute distress  HEENT: atraumatic, conjunttiva clear, no obvious abnormalities on inspection of external nose and ears  NECK: normal movements of the head and neck  LUNGS: on inspection no signs of respiratory distress, breathing rate appears normal, no obvious gross SOB, gasping or wheezing CV: no obvious cyanosis MS: moves all visible extremities without noticeable abnormality PSYCH/NEURO: pleasant and cooperative, no obvious depression or anxiety, speech and thought processing grossly intact Lab Results  Component Value Date   WBC 8.9 04/26/2020   HGB 13.9 04/26/2020   HCT 42.4 04/26/2020   PLT 414 (H) 04/26/2020   GLUCOSE 119 (H) 04/26/2020   CHOL 180 04/29/2015   TRIG 81 04/29/2015   HDL 56 04/29/2015   LDLCALC 112 04/29/2015   ALT 13 04/26/2020   AST 19 04/26/2020   NA 139 04/26/2020   K 3.2 (L) 04/26/2020   CL 105 04/26/2020   CREATININE 0.82 04/26/2020   BUN 7 04/26/2020   CO2 21 (L) 04/26/2020    ASSESSMENT AND PLAN:  Discussed the following assessment and plan:    ICD-10-CM   1. Non-intractable vomiting with nausea, unspecified vomiting type   R11.2 Basic metabolic panel    Sedimentation rate    CT Head Wo Contrast    HCG, Qualitative  2. Pressure in head  R51.9 Basic metabolic panel    Sedimentation rate    CT Head Wo Contrast    HCG, Qualitative   Ongoing problem over the last months with evaluations but some worsening of the nausea and dry heaving.  Apparently none focal neurologic signs at this point she does not feel the underlying anxiety is the main trigger.  Agree with her stopping the Lexapro temporarily as it can contribute to nausea. Uncertain why her potassium was 3.2 in the ED repeat chemistries today will do stat because it is Friday Order head  CT scan without for persistent head pain and nausea dry heaves vomiting. Told her that these test may be negative but that was the next step if she feels dehydrated in need of IV that would be an emergency room visit.  She can continue with water and PD can add Pedialyte soup broth etc. Depending on findings plan follow-up with her PCP. Counseled.  For alarm sx  Uncertain if findings began after vaccine initiation   Expectant management and discussion of plan and treatment with opportunity to ask questions and all were answered. The patient agreed with the plan and demonstrated an understanding of the instructions.   Advised to call back or seek an in-person evaluation if worsening  or having  further concerns . Return for depending on results and with pcp .   Berniece Andreas, MD

## 2020-05-06 NOTE — Telephone Encounter (Signed)
Patient called in to [facility] triage - Feeling nauseated for 24 hours and having pressure in head for about the last month with vomiting when drinking water this morning and having sinus issues.  Outcome- to be seen by PCP within 4 hours. Patient has an appointment with Berniece Andreas @ 9:30.

## 2020-05-06 NOTE — Telephone Encounter (Signed)
Please let her know that I reviewed the notes from todays visit. Hold the lexapro until further notice and after brain CT results have returned.

## 2020-05-06 NOTE — Telephone Encounter (Signed)
Pt would like a call today to discuss lab results and CT results  [phone]

## 2020-05-06 NOTE — Addendum Note (Signed)
Addended by: Bonnye Fava on: 05/06/2020 01:27 PM   Modules accepted: Orders

## 2020-05-06 NOTE — Addendum Note (Signed)
Addended by: Bonnye Fava on: 05/06/2020 01:32 PM   Modules accepted: Orders

## 2020-05-08 ENCOUNTER — Encounter: Payer: Self-pay | Admitting: Family Medicine

## 2020-05-09 ENCOUNTER — Telehealth: Payer: Self-pay | Admitting: Internal Medicine

## 2020-05-09 MED ORDER — PROMETHAZINE HCL 25 MG PO TABS: 25.0000 mg | ORAL_TABLET | Freq: Four times a day (QID) | ORAL | 0 refills | Status: DC | PRN

## 2020-05-09 NOTE — Telephone Encounter (Signed)
Patient called - pharmacy did not receive phenergan rx -- sent to pharmacy on file.

## 2020-05-09 NOTE — Telephone Encounter (Signed)
Patient calling to see if this medication could be prescribed and to go over lab results.

## 2020-05-10 NOTE — Telephone Encounter (Signed)
Called patient and left a detailed message on voicemail to return call. I was out of the office when she called.  Patient has seen comments from Dr. Fabian Sharp and has scheduled an appointment with Dr. Mardelle Matte on 05/11/2020.

## 2020-05-11 ENCOUNTER — Other Ambulatory Visit: Payer: Self-pay

## 2020-05-11 ENCOUNTER — Encounter: Payer: Self-pay | Admitting: Family Medicine

## 2020-05-11 ENCOUNTER — Ambulatory Visit (INDEPENDENT_AMBULATORY_CARE_PROVIDER_SITE_OTHER): Payer: BC Managed Care – PPO | Admitting: Family Medicine

## 2020-05-11 VITALS — BP 118/68 | HR 94 | Temp 98.6°F | Resp 18 | Ht 60.0 in | Wt 130.0 lb

## 2020-05-11 DIAGNOSIS — R42 Dizziness and giddiness: Secondary | ICD-10-CM

## 2020-05-11 DIAGNOSIS — E876 Hypokalemia: Secondary | ICD-10-CM

## 2020-05-11 DIAGNOSIS — R519 Headache, unspecified: Secondary | ICD-10-CM | POA: Diagnosis not present

## 2020-05-11 DIAGNOSIS — R112 Nausea with vomiting, unspecified: Secondary | ICD-10-CM | POA: Diagnosis not present

## 2020-05-11 MED ORDER — BUSPIRONE HCL 7.5 MG PO TABS
7.5000 mg | ORAL_TABLET | Freq: Three times a day (TID) | ORAL | 2 refills | Status: DC | PRN
Start: 2020-05-11 — End: 2020-06-08

## 2020-05-11 MED ORDER — OMEPRAZOLE 20 MG PO CPDR
20.0000 mg | DELAYED_RELEASE_CAPSULE | Freq: Every day | ORAL | 3 refills | Status: DC
Start: 2020-05-11 — End: 2020-06-08

## 2020-05-11 NOTE — Progress Notes (Signed)
Subjective  CC:  Chief Complaint  Patient presents with  . Sinus Problem    x 6 weeks. worse over time.   . Nausea    after taking abx for sinus abut 3 weeks ago.   . Emesis    one time 5 days ago     HPI: Christine Carey is a 34 y.o. female who presents to the office today to address the problems listed above in the chief complaint.  34 yo who hasn't felt well for weeks. Started with vertiginous sxs in late march. 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 Then got sinus pain/pressure. Went to ER: treated as sinus infection. Then started with nausea and persistent pressure: evaluated by ENT and allergist. Mild allergies and normal sinuses. Since, even after stopping augmentin, suffering form debilitating nausea and dry heaves in am. Now on am phenergan and does better w/o dry heaves but can't be functional until mid afternoon because of the facial pressure and nausea. Husband and mother are home to help care for the kids. Has occ dysequilibrium sxs. C/o constipation, occ GERD sxs which are new, pain in temporal areas when she valsalvas to move her bowels. She worries about brain tumor or other organic disease. Has h/o anxiety seeing therapist. Failed lexapro and celexa in the past.   ER eval and f/u visit evals: nl labs but for low potassium, mildly elevated platelets and nl brain CT w/o sinus pathology.   Pt denies h/o migraines, visual disturbances, abdominal pain outside of gerd sxs, melena, paresis paresthesias, dysarthria. Becoming depressed and distressed since she still feels badly.   Has tried ativan, xanax and phenergan for her sxs. Also was on allergy meds.  No visits with results within 1 Day(s) from this visit.  Latest known visit with results is:  Lab on 05/06/2020  Component Date Value Ref Range Status  . Preg, Serum 05/06/2020 NEGATIVE   Final  . Sodium 05/06/2020 138  135 - 145 mEq/L Final  . Potassium 05/06/2020 3.3* 3.5 - 5.1 mEq/L  Final  . Chloride 05/06/2020 102  96 - 112 mEq/L Final  . CO2 05/06/2020 27  19 - 32 mEq/L Final  . Glucose, Bld 05/06/2020 183* 70 - 99 mg/dL Final  . BUN 05/06/2020 6  6 - 23 mg/dL Final  . Creatinine, Ser 05/06/2020 0.71  0.40 - 1.20 mg/dL Final  . GFR 05/06/2020 94.77  >60.00 mL/min Final  . Calcium 05/06/2020 9.2  8.4 - 10.5 mg/dL Final  . Sed Rate 05/06/2020 17  0 - 20 mm/hr Final     Assessment  1. Non-intractable vomiting with nausea, unspecified vomiting type   2. Pressure in head   3. Hypokalemia   4. Dysequilibrium      Plan   Nausea, unspedified:  Suspect multifactorial: vertigo, abx, anxiety, and now gerd. Nl brain CT but unenhanced. Normal neuro exam now. Refer to GI, start prilosec, continue phenergan, recheck labs. Also refer to neuro: ? Migraines. Needs reassurance not brain pathology. Neuro can make the call if further brain imaging is warranted.   Anxiety: trial of buspar. Need to r/o organic pathology. If neg eval, then can get help from psych for OCD/anxiety sxs. Pt is tearful.   Recheck potassium  Follow up: 4 weeks for recheck.   06/24/2020  Orders Placed This Encounter  Procedures  . Comprehensive metabolic panel  . Prolactin  . Aldosterone  . T4, free  . T3  . TSH  .  Prealbumin  . H. pylori antibody, IgG  . Magnesium  . Ambulatory referral to Gastroenterology  . Ambulatory referral to Neurology   Meds ordered this encounter  Medications  . busPIRone (BUSPAR) 7.5 MG tablet    Sig: Take 1 tablet (7.5 mg total) by mouth 3 (three) times daily as needed (anxiety).    Dispense:  60 tablet    Refill:  2  . omeprazole (PRILOSEC) 20 MG capsule    Sig: Take 1 capsule (20 mg total) by mouth daily.    Dispense:  30 capsule    Refill:  3      I reviewed the patients updated PMH, FH, and SocHx.    Patient Active Problem List   Diagnosis Date Noted  . IUD (intrauterine device) in place - May 2018, Mirena 04/23/2018  . GAD (generalized anxiety  disorder) 04/23/2018   Current Meds  Medication Sig  . ALPRAZolam (XANAX) 0.5 MG tablet Take 1 tablet (0.5 mg total) by mouth daily as needed for anxiety.  Marland Kitchen levonorgestrel (MIRENA) 20 MCG/24HR IUD 1 each by Intrauterine route once.  . loratadine (CLARITIN) 10 MG tablet Take 10 mg by mouth daily.  . Oxymetazoline HCl (NASAL SPRAY 12 HOUR NA) Place into the nose.  . promethazine (PHENERGAN) 25 MG tablet Take 1 tablet (25 mg total) by mouth every 6 (six) hours as needed for nausea or vomiting.    Allergies: Patient has No Known Allergies. Family History: Patient family history includes Birth defects in her daughter; Healthy in her brother and daughter; Hyperlipidemia in her father and mother; Hypertension in her father and mother; Lung cancer in her maternal grandmother; Stroke in her paternal grandfather and paternal grandmother. Social History:  Patient  reports that she has never smoked. She has never used smokeless tobacco. She reports that she does not drink alcohol or use drugs.  Review of Systems: Constitutional: Negative for fever malaise or anorexia Cardiovascular: negative for chest pain Respiratory: negative for SOB or persistent cough Gastrointestinal: negative for abdominal pain  Objective  Vitals: BP 118/68   Pulse 94   Temp 98.6 F (37 C) (Temporal)   Resp 18   Ht 5' (1.524 m)   Wt 130 lb (59 kg)   SpO2 99%   BMI 25.39 kg/m  General: no acute distress , A&Ox3 Psych: anxious and tearful. Normal speech.  HEENT: PEERL, conjunctiva normal, neck is supple Cardiovascular:  RRR without murmur or gallop.  Respiratory:  Good breath sounds bilaterally, CTAB with normal respiratory effort Gastrointestinal: soft, flat abdomen, normal active bowel sounds, no palpable masses, no hepatosplenomegaly, no appreciated hernias Ext: no edema Neuro: no tremor, nl gait.  Skin:  Warm, no rashes     Commons side effects, risks, benefits, and alternatives for medications and  treatment plan prescribed today were discussed, and the patient expressed understanding of the given instructions. Patient is instructed to call or message via MyChart if he/she has any questions or concerns regarding our treatment plan. No barriers to understanding were identified. We discussed Red Flag symptoms and signs in detail. Patient expressed understanding regarding what to do in case of urgent or emergency type symptoms.   Medication list was reconciled, printed and provided to the patient in AVS. Patient instructions and summary information was reviewed with the patient as documented in the AVS. This note was prepared with assistance of Dragon voice recognition software. Occasional wrong-word or sound-a-like substitutions may have occurred due to the inherent limitations of voice recognition software  This  visit occurred during the SARS-CoV-2 public health emergency.  Safety protocols were in place, including screening questions prior to the visit, additional usage of staff PPE, and extensive cleaning of exam room while observing appropriate contact time as indicated for disinfecting solutions.

## 2020-05-11 NOTE — Patient Instructions (Signed)
Please return in 4 weeks for recheck.   I am referring you to gastroenterology due to your persistent nausea. i'd like you to take the omeprazole daily to help with the GERD symptoms. This may help your nausea as well.   I am referring you to neurology to ensure that your head pressure is not migraine or neurologically related.   Please start the buspar as needed for anxiety or worry symptoms. It is not sedating.   I will release your lab results to you on your MyChart account with further instructions. Please reply with any questions.   If you have any questions or concerns, please don't hesitate to send me a message via MyChart or call the office at [phone]. Thank you for visiting with [place] today! It's our pleasure caring for you.

## 2020-05-12 ENCOUNTER — Encounter: Payer: Self-pay | Admitting: Gastroenterology

## 2020-05-12 ENCOUNTER — Encounter: Payer: Self-pay | Admitting: Neurology

## 2020-05-12 LAB — COMPREHENSIVE METABOLIC PANEL
ALT: 8 U/L (ref 0–35)
AST: 14 U/L (ref 0–37)
Albumin: 5 g/dL (ref 3.5–5.2)
Alkaline Phosphatase: 39 U/L (ref 39–117)
BUN: 9 mg/dL (ref 6–23)
CO2: 28 mEq/L (ref 19–32)
Calcium: 9.7 mg/dL (ref 8.4–10.5)
Chloride: 104 mEq/L (ref 96–112)
Creatinine, Ser: 0.79 mg/dL (ref 0.40–1.20)
GFR: 83.78 mL/min (ref >60.00)
Glucose, Bld: 92 mg/dL (ref 70–99)
Potassium: 4.3 mEq/L (ref 3.5–5.1)
Sodium: 137 mEq/L (ref 135–145)
Total Bilirubin: 0.4 mg/dL (ref 0.2–1.2)
Total Protein: 7.7 g/dL (ref 6.0–8.3)

## 2020-05-12 LAB — H. PYLORI ANTIBODY, IGG: H Pylori IgG: NEGATIVE

## 2020-05-12 LAB — T4, FREE: Free T4: 0.88 ng/dL (ref 0.60–1.60)

## 2020-05-12 LAB — TSH: TSH: 0.72 u[IU]/mL (ref 0.35–4.50)

## 2020-05-12 LAB — MAGNESIUM: Magnesium: 2.4 mg/dL (ref 1.5–2.5)

## 2020-05-15 LAB — PROLACTIN: Prolactin: 3.5 ng/mL

## 2020-05-15 LAB — ALDOSTERONE: Aldosterone: 4 ng/dL

## 2020-05-15 LAB — PREALBUMIN: Prealbumin: 32 mg/dL (ref 17–34)

## 2020-05-15 LAB — T3: T3, Total: 74 ng/dL — ABNORMAL LOW (ref 76–181)

## 2020-06-08 ENCOUNTER — Ambulatory Visit (INDEPENDENT_AMBULATORY_CARE_PROVIDER_SITE_OTHER): Payer: BC Managed Care – PPO | Admitting: Family Medicine

## 2020-06-08 ENCOUNTER — Encounter: Payer: Self-pay | Admitting: Family Medicine

## 2020-06-08 ENCOUNTER — Other Ambulatory Visit: Payer: Self-pay

## 2020-06-08 VITALS — BP 110/62 | HR 70 | Temp 97.3°F | Ht 60.0 in | Wt 131.0 lb

## 2020-06-08 DIAGNOSIS — G44229 Chronic tension-type headache, not intractable: Secondary | ICD-10-CM | POA: Diagnosis not present

## 2020-06-08 DIAGNOSIS — F411 Generalized anxiety disorder: Secondary | ICD-10-CM | POA: Diagnosis not present

## 2020-06-08 DIAGNOSIS — R112 Nausea with vomiting, unspecified: Secondary | ICD-10-CM

## 2020-06-08 MED ORDER — PROMETHAZINE HCL 25 MG PO TABS: 25.0000 mg | ORAL_TABLET | Freq: Four times a day (QID) | ORAL | 0 refills | Status: AC | PRN

## 2020-06-08 NOTE — Patient Instructions (Signed)
Please see the specialists as we discussed.   I have refilled your medications.   Let's schedule a complete physical in 6 months.

## 2020-06-08 NOTE — Progress Notes (Signed)
Subjective  CC:  Chief Complaint  Patient presents with  . Nausea    she has had little improvement but has found a way to treat symptoms.   . Anxiety    She did not notice any improvement on buspar so she has not been taking.   . Recurrent Skin Infections    right under arm x 1 week.     HPI: Christine Carey is a 34 y.o. female who presents to the office today to address the problems listed above in the chief complaint.  Reviewed her sxs since onset: vertigo, pain in sinuses, fronatl headaaches and neck pain, nausea, panic around fear of health problems.   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   Still with neck pain, headaches, poor sleep, nausea - worse if lies on left side at night.   Seeing therapist and will have psych eval in august. Didn't tolerate the lexapro. Using xanax sparingly.    Assessment  1. Non-intractable vomiting with nausea, unspecified vomiting type   2. Chronic tension-type headache, not intractable   3. GAD (generalized anxiety disorder)      Plan   nausea:  May be anxiety related but to see GI soon for eval. Phenergan as needed. PPI did not help  Headaches: c/w tension type headaches. Continue with massage, advil and f/u neuro  GAD: therapy; xanax and to see if psych can dx and help with medications.   Follow up: Return in about 6 months (around 12/08/2020) for complete physical.    No orders of the defined types were placed in this encounter.  Meds ordered this encounter  Medications  . promethazine (PHENERGAN) 25 MG tablet    Sig: Take 1 tablet (25 mg total) by mouth every 6 (six) hours as needed for nausea or vomiting.    Dispense:  20 tablet    Refill:  0      I reviewed the patients updated PMH, FH, and SocHx.    Patient Active Problem List   Diagnosis Date Noted  . IUD (intrauterine device) in place - May 2018, Mirena 04/23/2018  . GAD (generalized anxiety disorder) 04/23/2018   Current Meds    Medication Sig  . ALPRAZolam (XANAX) 0.5 MG tablet Take 1 tablet (0.5 mg total) by mouth daily as needed for anxiety.  . diazepam (VALIUM) 2 MG tablet Take by mouth.  . levonorgestrel (MIRENA) 20 MCG/24HR IUD 1 each by Intrauterine route once.  . promethazine (PHENERGAN) 25 MG tablet Take 1 tablet (25 mg total) by mouth every 6 (six) hours as needed for nausea or vomiting.  . [DISCONTINUED] promethazine (PHENERGAN) 25 MG tablet Take 1 tablet (25 mg total) by mouth every 6 (six) hours as needed for nausea or vomiting.    Allergies: Patient has No Known Allergies. Family History: Patient family history includes Birth defects in her daughter; Healthy in her brother and daughter; Hyperlipidemia in her father and mother; Hypertension in her father and mother; Lung cancer in her maternal grandmother; Stroke in her paternal grandfather and paternal grandmother. Social History:  Patient  reports that she has never smoked. She has never used smokeless tobacco. She reports that she does not drink alcohol and does not use drugs.  Review of Systems: Constitutional: Negative for fever malaise or anorexia Cardiovascular: negative for chest pain Respiratory: negative for SOB or persistent cough Gastrointestinal: negative for abdominal pain  Objective  Vitals: BP 110/62   Pulse  70   Temp (!) 97.3 F (36.3 C) (Temporal)   Ht 5' (1.524 m)   Wt 131 lb (59.4 kg)   SpO2 100%   BMI 25.58 kg/m  General: no acute distress , A&Ox3 Psych: anxious and tearful     Commons side effects, risks, benefits, and alternatives for medications and treatment plan prescribed today were discussed, and the patient expressed understanding of the given instructions. Patient is instructed to call or message via MyChart if he/she has any questions or concerns regarding our treatment plan. No barriers to understanding were identified. We discussed Red Flag symptoms and signs in detail. Patient expressed understanding  regarding what to do in case of urgent or emergency type symptoms.   Medication list was reconciled, printed and provided to the patient in AVS. Patient instructions and summary information was reviewed with the patient as documented in the AVS. This note was prepared with assistance of Dragon voice recognition software. Occasional wrong-word or sound-a-like substitutions may have occurred due to the inherent limitations of voice recognition software  This visit occurred during the SARS-CoV-2 public health emergency.  Safety protocols were in place, including screening questions prior to the visit, additional usage of staff PPE, and extensive cleaning of exam room while observing appropriate contact time as indicated for disinfecting solutions.

## 2020-06-17 ENCOUNTER — Ambulatory Visit: Payer: BC Managed Care – PPO | Admitting: Gastroenterology

## 2020-06-24 ENCOUNTER — Encounter: Payer: BLUE CROSS/BLUE SHIELD | Admitting: Family Medicine

## 2020-08-08 NOTE — Progress Notes (Deleted)
NEUROLOGY CONSULTATION NOTE  Sahian Kerney MRN: [id number] DOB: 1986-05-06  Referring provider: Asencion Partridge, MD Primary care provider: Asencion Partridge, MD  Reason for consult:  Pressure in head, disequilibrium  HISTORY OF PRESENT ILLNESS: Christine Carey is a 34 year old ***-handed female who presents for head pressure and disequilibrium.  History supplemented by family medicine and vestibular clinic notes.  In late March, she developed episode of vertigo, ***.  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  She was prescribed meclizine for this and symptoms resolved.  However, they returned several days later. She reports spinning sensation aggravated with head turning and anxiety.  She also reports occasional tinnitus and aural fullness in her right ear.  No pulsatile tinnitus.  Then she developed sinus pain and pressure in ***.  She went to the ED and was treated for sinus infection with augmentin.  ***.  She saw ENT who found no evidence of sinusitis.  She saw an allergist who only noted mild allergies to tree pollen and was prescribed Allegra and Dysmista.  She was evaluated by physical therapy.  Dix-Hallpike was negative for BPPV.  CT head on 05/06/2020 personally reviewed was normal.  She saw ENT and audiology at [facility].  Audiometric testing and VNG normal.  She endorsed neck pain and was diagnosed with cervical vertigo.  She underwent OMT.  PAST MEDICAL HISTORY: Past Medical History:  Diagnosis Date  . Anxiety     PAST SURGICAL HISTORY: Past Surgical History:  Procedure Laterality Date  . WISDOM TOOTH EXTRACTION      MEDICATIONS: Current Outpatient Medications on File Prior to Visit  Medication Sig Dispense Refill  . ALPRAZolam (XANAX) 0.5 MG tablet Take 1 tablet (0.5 mg total) by mouth daily as needed for anxiety. 30 tablet 0  . diazepam (VALIUM) 2 MG tablet Take by mouth.    . levonorgestrel (MIRENA) 20 MCG/24HR IUD 1 each by Intrauterine route once.      . loratadine (CLARITIN) 10 MG tablet Take 10 mg by mouth daily. (Patient not taking: Reported on 06/08/2020)    . Oxymetazoline HCl (NASAL SPRAY 12 HOUR NA) Place into the nose. (Patient not taking: Reported on 06/08/2020)    . promethazine (PHENERGAN) 25 MG tablet Take 1 tablet (25 mg total) by mouth every 6 (six) hours as needed for nausea or vomiting. 20 tablet 0   No current facility-administered medications on file prior to visit.    ALLERGIES: No Known Allergies  FAMILY HISTORY: Family History  Problem Relation Age of Onset  . Hypertension Mother   . Hyperlipidemia Mother   . Hypertension Father   . Hyperlipidemia Father   . Birth defects Daughter        1 kidney  . Lung cancer Maternal Grandmother   . Stroke Paternal Grandmother   . Stroke Paternal Grandfather   . Healthy Daughter   . Healthy Brother    ***.  SOCIAL HISTORY: Social History   Socioeconomic History  . Marital status: Married    Spouse name: Sam  . Number of children: 2  . Years of education: Not on file  . Highest education level: Not on file  Occupational History  . 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 Smoking status: Never Smoker  . Smokeless tobacco: Never Used  Vaping Use  . Vaping Use: Never used  Substance and Sexual Activity  . Alcohol use: No    Comment: occasionally  . Drug  use: No  . Sexual activity: Yes    Birth control/protection: I.U.D.  Other Topics Concern  . Not on file  Social History Narrative   Originally from [place]   Social Determinants of [employer] Strain:   . Difficulty of Paying Living Expenses:   Food Insecurity:   . Worried About Programme researcher, broadcasting/film/video in the Last Year:   . Barista in the Last Year:   Transportation Needs:   . Freight forwarder (Medical):   Marland Kitchen Lack of Transportation (Non-Medical):   Physical Activity:   . Days of Exercise per Week:   . Minutes of Exercise per Session:   Stress:   .  Feeling of Stress :   Social Connections:   . Frequency of Communication with Friends and Family:   . Frequency of Social Gatherings with Friends and Family:   . Attends Religious Services:   . Active Member of Clubs or Organizations:   . Attends Banker Meetings:   Marland Kitchen Marital Status:   Intimate Partner Violence:   . Fear of Current or Ex-Partner:   . Emotionally Abused:   Marland Kitchen Physically Abused:   . Sexually Abused:     REVIEW OF SYSTEMS: Constitutional: No fevers, chills, or sweats, no generalized fatigue, change in appetite Eyes: No visual changes, double vision, eye pain Ear, nose and throat: No hearing loss, ear pain, nasal congestion, sore throat Cardiovascular: No chest pain, palpitations Respiratory:  No shortness of breath at rest or with exertion, wheezes GastrointestinaI: No nausea, vomiting, diarrhea, abdominal pain, fecal incontinence Genitourinary:  No dysuria, urinary retention or frequency Musculoskeletal:  No neck pain, back pain Integumentary: No rash, pruritus, skin lesions Neurological: as above Psychiatric: No depression, insomnia, anxiety Endocrine: No palpitations, fatigue, diaphoresis, mood swings, change in appetite, change in weight, increased thirst Hematologic/Lymphatic:  No purpura, petechiae. Allergic/Immunologic: no itchy/runny eyes, nasal congestion, recent allergic reactions, rashes  PHYSICAL EXAM: *** General: No acute distress.  Patient appears ***-groomed.  *** Head:  Normocephalic/atraumatic Eyes:  fundi examined but not visualized Neck: supple, no paraspinal tenderness, full range of motion Back: No paraspinal tenderness Heart: regular rate and rhythm Lungs: Clear to auscultation bilaterally. Vascular: No carotid bruits. Neurological Exam: Mental status: alert and oriented to person, place, and time, recent and remote memory intact, fund of knowledge intact, attention and concentration intact, speech fluent and not dysarthric,  language intact. Cranial nerves: CN I: not tested CN II: pupils equal, round and reactive to light, visual fields intact CN III, IV, VI:  full range of motion, no nystagmus, no ptosis CN V: facial sensation intact CN VII: upper and lower face symmetric CN VIII: hearing intact CN IX, X: gag intact, uvula midline CN XI: sternocleidomastoid and trapezius muscles intact CN XII: tongue midline Bulk & Tone: normal, no fasciculations. Motor:  5/5 throughout *** Sensation:  Pinprick *** temperature *** and vibration sensation intact.  ***. Deep Tendon Reflexes:  2+ throughout, *** toes downgoing.  *** Finger to nose testing:  Without dysmetria.  *** Heel to shin:  Without dysmetria.  *** Gait:  Normal station and stride.  Able to turn and tandem walk. Romberg ***.  IMPRESSION: ***  PLAN: ***  Thank you for allowing me to take part in the care of this patient.  Shon Millet, DO  CC: ***

## 2020-08-09 ENCOUNTER — Ambulatory Visit: Payer: BC Managed Care – PPO | Admitting: Neurology

## 2020-08-16 ENCOUNTER — Ambulatory Visit: Payer: BC Managed Care – PPO | Admitting: Gastroenterology

## 2020-10-11 ENCOUNTER — Ambulatory Visit (INDEPENDENT_AMBULATORY_CARE_PROVIDER_SITE_OTHER): Payer: BC Managed Care – PPO | Admitting: Psychology

## 2020-10-11 DIAGNOSIS — F411 Generalized anxiety disorder: Secondary | ICD-10-CM

## 2020-10-20 ENCOUNTER — Ambulatory Visit (INDEPENDENT_AMBULATORY_CARE_PROVIDER_SITE_OTHER): Payer: BC Managed Care – PPO | Admitting: Psychology

## 2020-10-20 DIAGNOSIS — F411 Generalized anxiety disorder: Secondary | ICD-10-CM | POA: Diagnosis not present

## 2020-10-25 ENCOUNTER — Ambulatory Visit (INDEPENDENT_AMBULATORY_CARE_PROVIDER_SITE_OTHER): Payer: BC Managed Care – PPO | Admitting: Psychology

## 2020-10-25 DIAGNOSIS — F411 Generalized anxiety disorder: Secondary | ICD-10-CM | POA: Diagnosis not present

## 2020-10-27 NOTE — Progress Notes (Signed)
NEUROLOGY CONSULTATION NOTE  Christine Carey MRN: [id number] DOB: 19-Jun-1986  Referring provider: Asencion Partridge, MD Primary care provider: Brett Fairy, MD  Reason for consult:  Pressure in head, disequilibrium  HISTORY OF PRESENT ILLNESS: Christine Carey is a 34 year old right-handed female who presents for head pressure and dizziness.  History supplemented by audiology and referring provider's notes.  She is accompanied by her spouse.  In late March 2021, she received her first [employer] vaccine.  In early April, she developed vertigo/spinning episode.  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  Then next day, she felt off and developed brief spinning when bending over or other change in position.  She started experiencing sinus pressure.  She was evaluated by ENT and allergist.  She was found to have allergies to pollen.  She was noted to have inflammation in her nose and treated with antihistamines and steroids, which were ineffective.  She was started on cefdinir for sinus infection. When she finished, she began experiencing severe nausea and dry heaves.  She returned to the ED in May.  CT head, personally reviewed, showed normal sinuses and no acute intracranial abnormality and was started on augmentin for presumed sinusitis.  She began experiencing aural fullness and tinnitus.  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  She noted minimal improvement.  She reported some neck pain and cracking in her neck with movement.  She went to physical therapy and later a chiropractor.  Cervical spine X-ray reportedly showed that her C1-2 was "off".  She underwent 8 weeks of therapy including adjustments and dry needling which did not produce significant improvement.  She underwent VNG testing in July in which findings significant for cervical vertigo, particularly in head right position but due to intolerance, the  caloric testing was not completed and could not rule out left unilateral vestibular hypofunction.  Continued PT/OMT for cervical vertigo, VOR strengthening exercises was recommended.  She still has a fairly persistent dizziness, not a spinning but a feeling of being off-balance.  In the mornings, she has dull pressure in a band-like distribution around her head and back of her neck.  There may be some nausea and photophobia.  It tends to subside later in the day.  She does report increased anxiety.  Current medications: alprazolam 0.5mg  daily PRN, promethazine 25mg , Mirena  PAST MEDICAL HISTORY: Past Medical History:  Diagnosis Date  . Anxiety   . Chronic tension headaches   . GAD (generalized anxiety disorder)   . Mild tricuspid regurgitation   . Panic attacks   . Seasonal allergies   . Somatic dysfunction of cervical region   . Tinnitus   . Vertigo     PAST SURGICAL HISTORY: Past Surgical History:  Procedure Laterality Date  . WISDOM TOOTH EXTRACTION      MEDICATIONS: Current Outpatient Medications on File Prior to Visit  Medication Sig Dispense Refill  . ALPRAZolam (XANAX) 0.5 MG tablet Take 1 tablet (0.5 mg total) by mouth daily as needed for anxiety. 30 tablet 0  . diazepam (VALIUM) 2 MG tablet Take by mouth.    . levonorgestrel (MIRENA) 20 MCG/24HR IUD 1 each by Intrauterine route once.    . loratadine (CLARITIN) 10 MG tablet Take 10 mg by mouth daily. (Patient not taking: Reported on 06/08/2020)    . Oxymetazoline HCl (NASAL SPRAY 12 HOUR NA) Place into the nose. (Patient not taking: Reported  on 06/08/2020)    . promethazine (PHENERGAN) 25 MG tablet Take 1 tablet (25 mg total) by mouth every 6 (six) hours as needed for nausea or vomiting. 20 tablet 0   No current facility-administered medications on file prior to visit.    ALLERGIES: No Known Allergies  FAMILY HISTORY: Family History  Problem Relation Age of Onset  . Hypertension Mother   . Hyperlipidemia Mother   .  Hypertension Father   . Hyperlipidemia Father   . Birth defects Daughter        1 kidney  . Lung cancer Maternal Grandmother   . Stroke Paternal Grandmother   . Stroke Paternal Grandfather   . Healthy Daughter   . Healthy Brother   . Colon cancer Neg Hx   . Stomach cancer Neg Hx   . Rectal cancer Neg Hx   . Pancreatic cancer Neg Hx     SOCIAL HISTORY: Social History   Socioeconomic History  . Marital status: Married    Spouse name: Sam  . Number of children: 2  . Years of education: Not on file  . Highest education level: Not on file  Occupational History  . 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 Smoking status: Never Smoker  . Smokeless tobacco: Never Used  Vaping Use  . Vaping Use: Never used  Substance and Sexual Activity  . Alcohol use: No    Comment: occasionally  . Drug use: No  . Sexual activity: Yes    Birth control/protection: I.U.D.  Other Topics Concern  . Not on file  Social History Narrative   Originally from [place]   Social Determinants of [employer] Strain:   . Difficulty of Paying Living Expenses: Not on file  Food Insecurity:   . Worried About Programme researcher, broadcasting/film/video in the Last Year: Not on file  . Ran Out of Food in the Last Year: Not on file  Transportation Needs:   . Lack of Transportation (Medical): Not on file  . Lack of Transportation (Non-Medical): Not on file  Physical Activity:   . Days of Exercise per Week: Not on file  . Minutes of Exercise per Session: Not on file  Stress:   . Feeling of Stress : Not on file  Social Connections:   . Frequency of Communication with Friends and Family: Not on file  . Frequency of Social Gatherings with Friends and Family: Not on file  . Attends Religious Services: Not on file  . Active Member of Clubs or Organizations: Not on file  . Attends Banker Meetings: Not on file  . Marital Status: Not on file  Intimate Partner Violence:   . Fear of  Current or Ex-Partner: Not on file  . Emotionally Abused: Not on file  . Physically Abused: Not on file  . Sexually Abused: Not on file    PHYSICAL EXAM: Blood pressure 110/75, pulse 93, height 4\' 11"  (1.499 m), weight 131 lb 6.4 oz (59.6 kg), SpO2 100 %, General: No acute distress.  Patient appears well-groomed.  Head:  Normocephalic/atraumatic Eyes:  fundi examined but not visualized Neck: supple, no paraspinal tenderness, full range of motion Back: No paraspinal tenderness Heart: regular rate and rhythm Lungs: Clear to auscultation bilaterally. Vascular: No carotid bruits. Neurological Exam: Mental status: alert and oriented to person, place, and time, recent and remote memory intact, fund of knowledge intact, attention and concentration intact, speech fluent and not dysarthric, language intact.  Cranial nerves: CN I: not tested CN II: pupils equal, round and reactive to light, visual fields intact CN III, IV, VI:  full range of motion, no nystagmus, no ptosis CN V: facial sensation intact CN VII: upper and lower face symmetric CN VIII: hearing intact CN IX, X: gag intact, uvula midline CN XI: sternocleidomastoid and trapezius muscles intact CN XII: tongue midline Bulk & Tone: normal, no fasciculations. Motor:  5/5 throughout  Sensation:  Pinprick and vibration sensation intact. Deep Tendon Reflexes:  2+ throughout, toes downgoing.  Finger to nose testing:  Without dysmetria.  Heel to shin:  Without dysmetria.  Gait:  Normal station and stride.  Able to turn and tandem walk. Romberg negative.  IMPRESSION: 1.  Episodic tension type headache 2.  Cervical vertigo 3.  Cervicalgia  PLAN: 1.  As she has failed 8 weeks of conservative management, will order MRI of cervical spine without contrast 2.  Refer to vestibular rehab 3.  Start nortriptyline 10mg  at bedtime.  If no improvement in 6 weeks, we can increase dose to 25mg  at bedtime. 4.  Follow up 4 to 6 weeks.  Thank you  for allowing me to take part in the care of this patient.  , DO  CC:  , MD  Shon Millet, MD

## 2020-10-28 ENCOUNTER — Ambulatory Visit (INDEPENDENT_AMBULATORY_CARE_PROVIDER_SITE_OTHER): Payer: BC Managed Care – PPO | Admitting: Neurology

## 2020-10-28 ENCOUNTER — Other Ambulatory Visit: Payer: Self-pay

## 2020-10-28 ENCOUNTER — Encounter: Payer: Self-pay | Admitting: Neurology

## 2020-10-28 VITALS — BP 110/75 | HR 93 | Ht 59.0 in | Wt 131.4 lb

## 2020-10-28 DIAGNOSIS — M542 Cervicalgia: Secondary | ICD-10-CM

## 2020-10-28 DIAGNOSIS — R42 Dizziness and giddiness: Secondary | ICD-10-CM | POA: Diagnosis not present

## 2020-10-28 DIAGNOSIS — G44219 Episodic tension-type headache, not intractable: Secondary | ICD-10-CM | POA: Diagnosis not present

## 2020-10-28 MED ORDER — NORTRIPTYLINE HCL 10 MG PO CAPS
10.0000 mg | ORAL_CAPSULE | Freq: Every day | ORAL | 5 refills | Status: DC
Start: 2020-10-28 — End: 2021-01-13

## 2020-10-28 NOTE — Patient Instructions (Addendum)
1.  Start nortriptyline 10mg  at bedtime.  If no improvement in 6 weeks, contact me and we can increase dose 2.  Refer to physical therapy for vestibular rehab 3.  Will check MRI of cervical spine. We have sent a referral to [facility] for your MRI and they will call you directly to schedule your appointment. They are located at [address] [facility]. If you need to contact them directly please call [phone].  4.  Follow up 4 to 6 months

## 2020-11-02 ENCOUNTER — Ambulatory Visit (INDEPENDENT_AMBULATORY_CARE_PROVIDER_SITE_OTHER): Payer: BC Managed Care – PPO | Admitting: Psychology

## 2020-11-02 DIAGNOSIS — F411 Generalized anxiety disorder: Secondary | ICD-10-CM | POA: Diagnosis not present

## 2020-11-08 ENCOUNTER — Ambulatory Visit (INDEPENDENT_AMBULATORY_CARE_PROVIDER_SITE_OTHER): Payer: BC Managed Care – PPO | Admitting: Psychology

## 2020-11-08 DIAGNOSIS — F411 Generalized anxiety disorder: Secondary | ICD-10-CM

## 2020-11-15 ENCOUNTER — Ambulatory Visit (INDEPENDENT_AMBULATORY_CARE_PROVIDER_SITE_OTHER): Payer: BC Managed Care – PPO | Admitting: Psychology

## 2020-11-15 DIAGNOSIS — F411 Generalized anxiety disorder: Secondary | ICD-10-CM | POA: Diagnosis not present

## 2020-11-19 ENCOUNTER — Ambulatory Visit
Admission: RE | Admit: 2020-11-19 | Discharge: 2020-11-19 | Disposition: A | Discharge status: Home / Self Care | Payer: BC Managed Care – PPO | Source: Ambulatory Visit | Attending: Neurology | Admitting: Neurology

## 2020-11-19 ENCOUNTER — Other Ambulatory Visit: Payer: Self-pay

## 2020-11-19 DIAGNOSIS — R42 Dizziness and giddiness: Secondary | ICD-10-CM

## 2020-11-19 DIAGNOSIS — M542 Cervicalgia: Secondary | ICD-10-CM

## 2020-11-19 DIAGNOSIS — G44219 Episodic tension-type headache, not intractable: Secondary | ICD-10-CM

## 2020-11-19 IMAGING — MR MR CERVICAL SPINE W/O CM
4 of 5 series · 27 of 48 positions shown · non-contrast
Comparison: None available.

CLINICAL DATA: Initial evaluation for chronic neck pain, tightness
in upper neck/base of skull, tension headaches.

EXAM:
MRI CERVICAL SPINE WITHOUT CONTRAST
TECHNIQUE: Multiplanar, multisequence MR imaging of the cervical spine was
performed. No intravenous contrast was administered.

[Series 5: T2 · sagittal · 3.0mm · 0.55mm/px · 6 of 15 slices shown (1 of 2)]
[im 1/15]
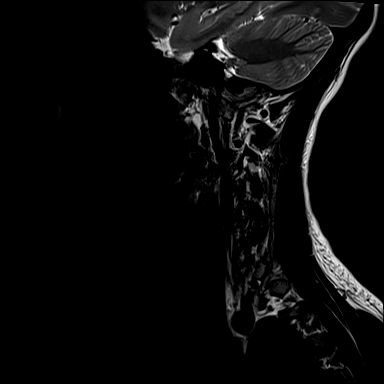
[im 3/15]
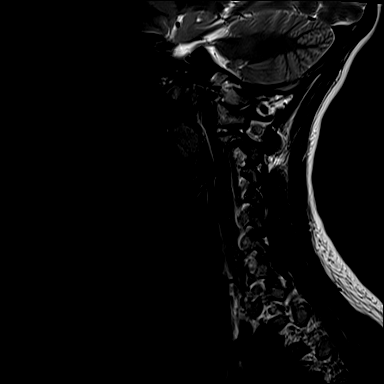
[im 6/15]
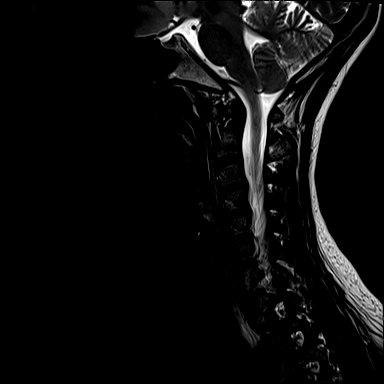
[im 9/15]
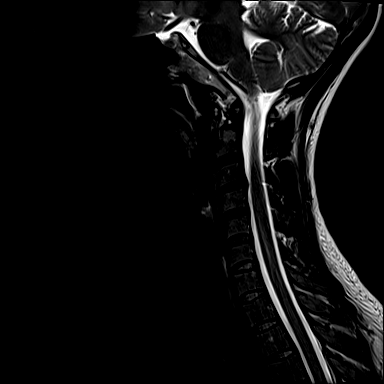
[im 12/15]
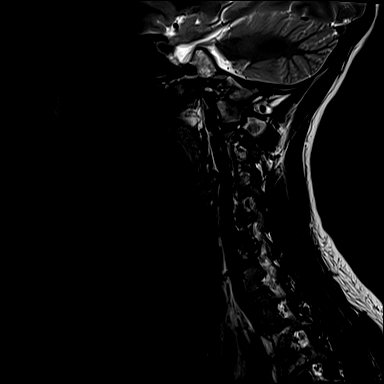
[im 15/15]
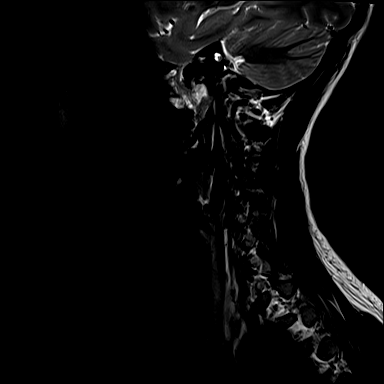

[Series 6: T1 · sagittal · 3.0mm · 0.66mm/px · 7 of 15 slices shown]
[im 1/15]
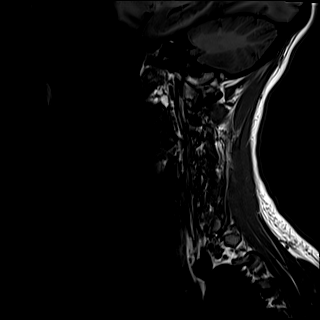
[im 3/15]
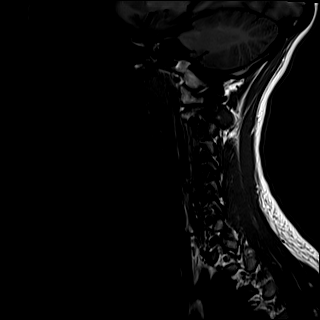
[im 5/15]
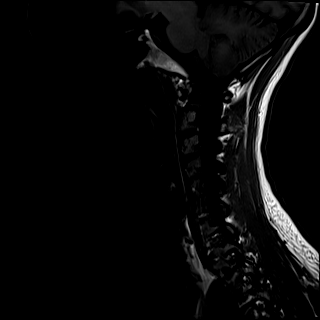
[im 8/15]
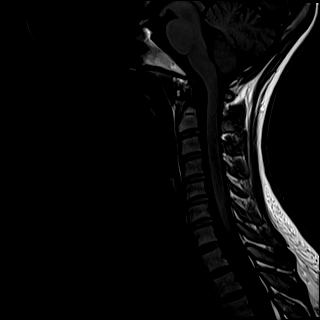
[im 10/15]
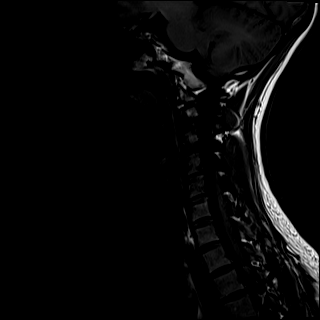
[im 12/15]
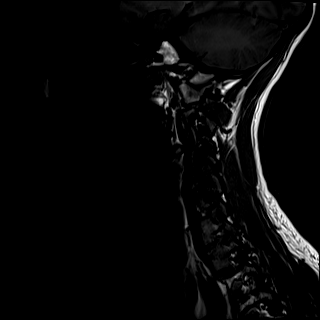
[im 15/15]
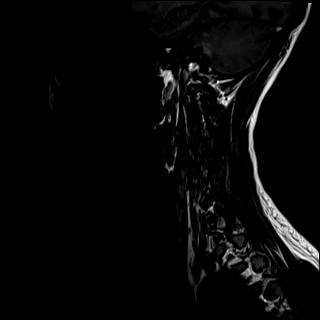

[Series 7: STIR · sagittal · 3.0mm · 0.33mm/px · 6 of 15 slices shown]
[im 1/15]
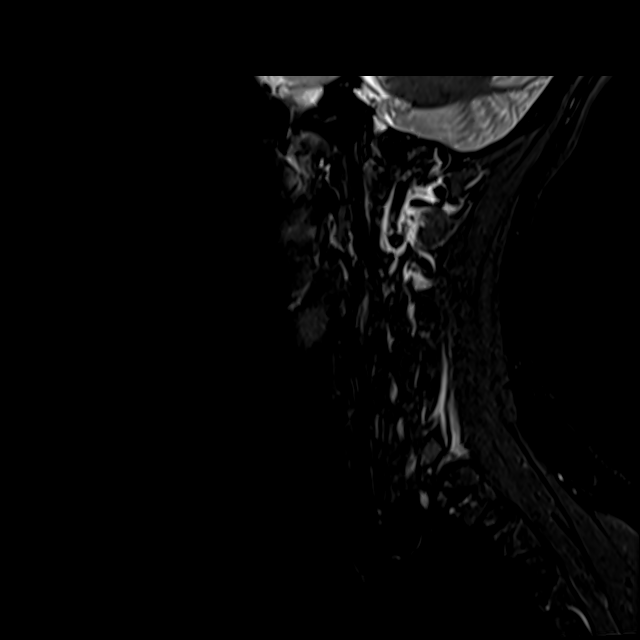
[im 3/15]
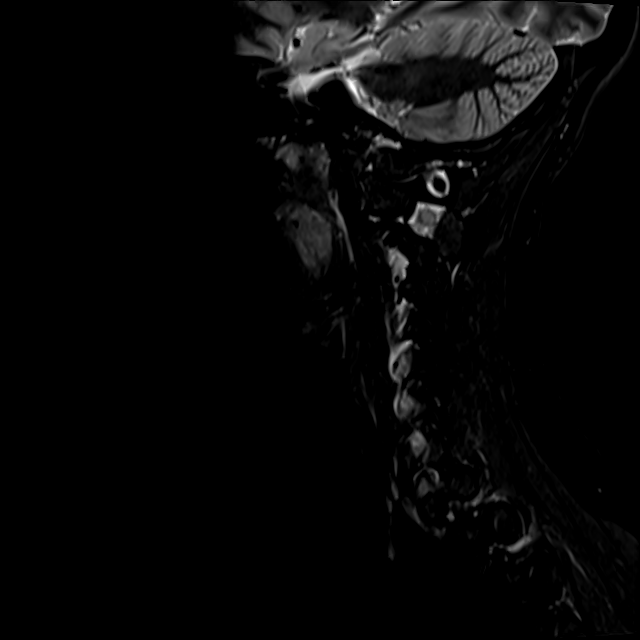
[im 5/15]
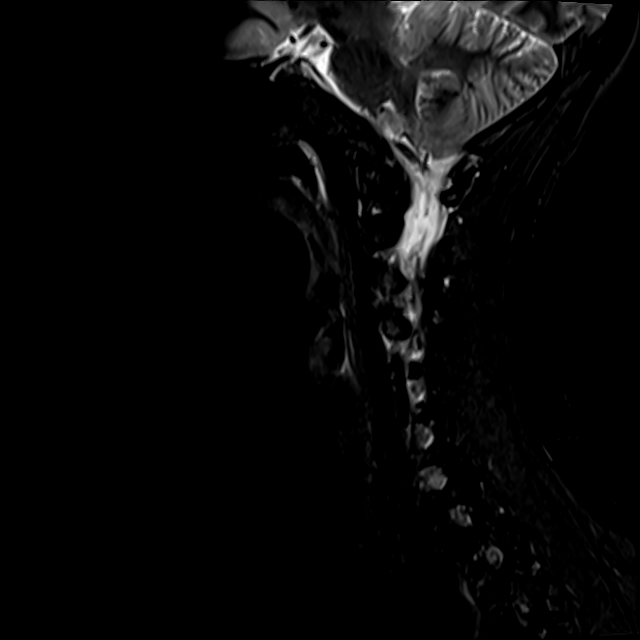
[im 8/15]
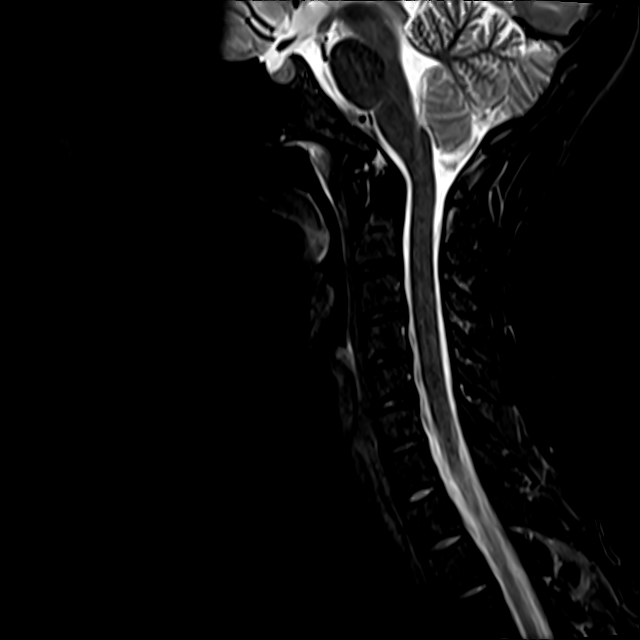
[im 10/15]
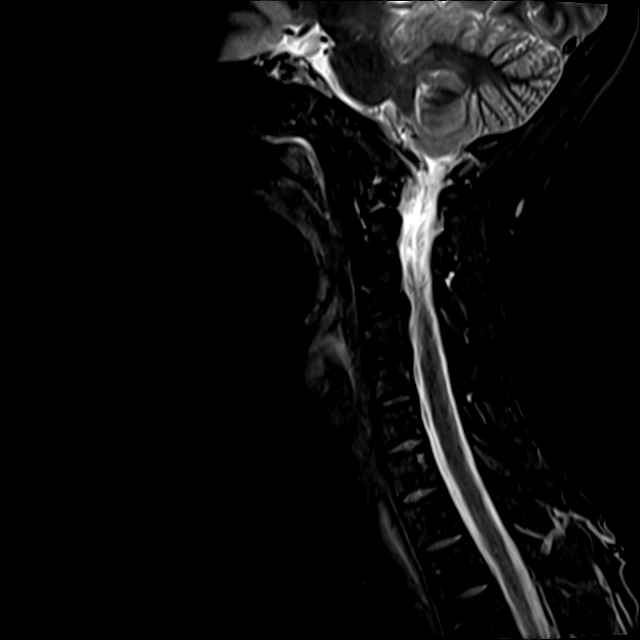
[im 12/15]
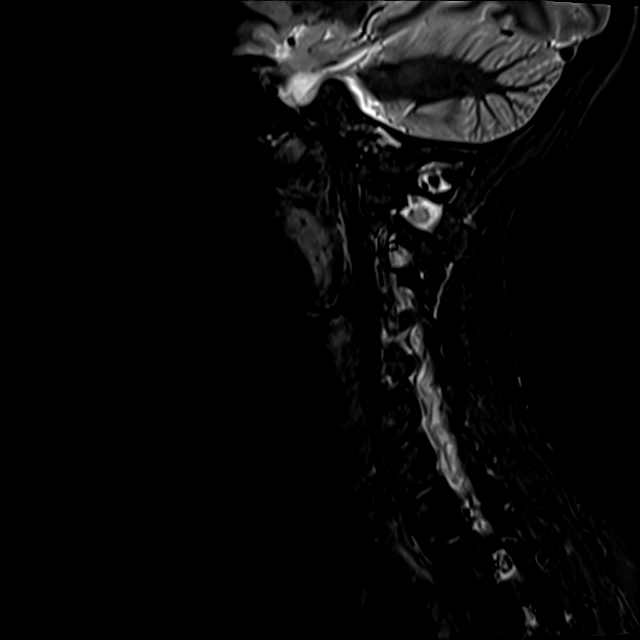

[Series 8: T2 · axial · 3.0mm · 0.50mm/px · z∈[-40,+53]mm · 8 of 30 slices shown (2 of 2)]
[im 1/30]
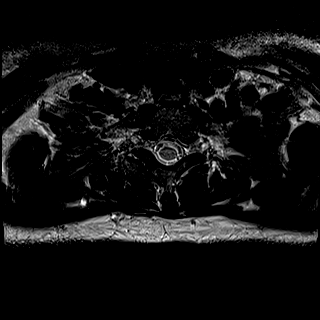
[im 5/30]
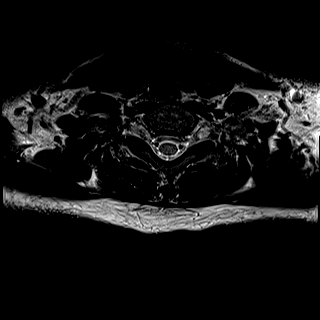
[im 9/30]
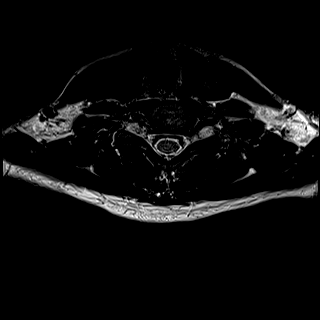
[im 14/30]
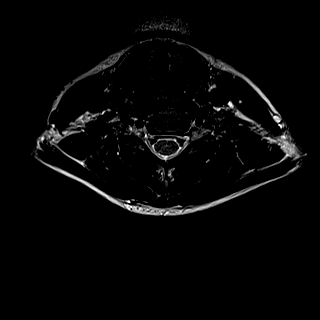
[im 16/30]
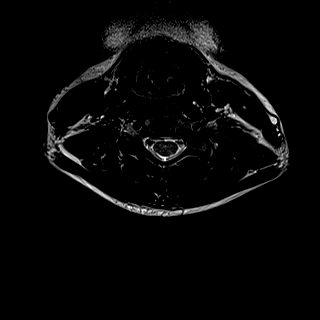
[im 21/30]
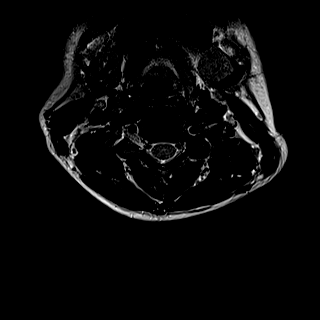
[im 25/30]
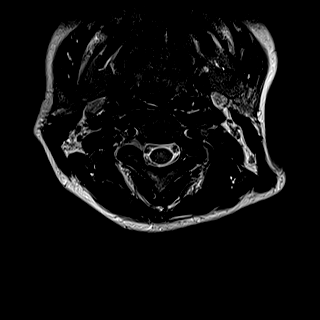
[im 30/30]
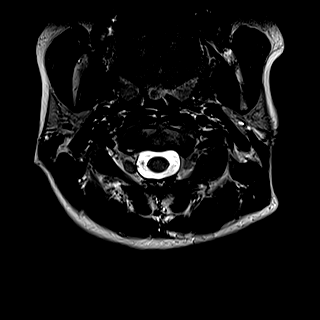

[27 of 48 positions shown; findings below may reference images not displayed]

FINDINGS: Alignment: Mild straightening of the normal cervical lordosis. No
listhesis or static subluxation.

Vertebrae: Vertebral body height maintained without acute or chronic
fracture. Bone marrow signal intensity within normal limits.
Subcentimeter benign hemangioma noted within the C7 vertebral body.
No worrisome osseous lesions. No abnormal marrow edema.

Cord: Normal signal and morphology.

Posterior Fossa, vertebral arteries, paraspinal tissues: Visualized
brain and posterior fossa within normal limits. Craniocervical
junction normal. Paraspinous and prevertebral soft tissues within
normal limits. Normal intravascular flow voids seen within the
vertebral arteries bilaterally.

Disc levels:

C2-C3: Unremarkable.

C3-C4: Mild diffuse disc bulge, slightly eccentric to the left.
Associated minimal uncovertebral spurring. No significant spinal
stenosis. Foramina remain patent.

C4-C5: Minimal annular disc bulge. No spinal stenosis. Foramina
remain patent.

C5-C6: Disc desiccation with minimal annular disc bulge. No spinal
stenosis. Foramina remain patent.

C6-C7: Disc desiccation without significant disc bulge. No canal or
foraminal stenosis.

C7-T1:  Unremarkable.

Visualized upper thoracic spine demonstrates no significant finding.
IMPRESSION: 1. Mild noncompressive disc bulging at C3-4 through C6-7 without
significant stenosis or neural impingement.
2. Otherwise unremarkable and normal MRI of the cervical spine. No
other findings to explain patient's symptoms identified.

## 2020-11-21 ENCOUNTER — Telehealth: Payer: Self-pay

## 2020-11-21 NOTE — Telephone Encounter (Signed)
Pt advised of her Mri results. Pt will send  A mychart message with couple of questions for Dr. Everlena Cooper.

## 2020-11-21 NOTE — Telephone Encounter (Signed)
-----   Message from Drema Dallas, DO sent at 11/21/2020 11:56 AM EST ----- MRI of cervical spine shows mild disc bulges but nothing significant that would warrant change in current management.

## 2020-11-23 ENCOUNTER — Ambulatory Visit (INDEPENDENT_AMBULATORY_CARE_PROVIDER_SITE_OTHER): Payer: BC Managed Care – PPO | Admitting: Psychology

## 2020-11-23 DIAGNOSIS — F411 Generalized anxiety disorder: Secondary | ICD-10-CM | POA: Diagnosis not present

## 2020-11-24 ENCOUNTER — Other Ambulatory Visit: Payer: Self-pay | Admitting: Neurology

## 2020-11-24 MED ORDER — TIZANIDINE HCL 4 MG PO TABS: 4.0000 mg | ORAL_TABLET | Freq: Every day | ORAL | 5 refills | Status: DC

## 2020-11-29 ENCOUNTER — Ambulatory Visit: Payer: BC Managed Care – PPO | Admitting: Psychology

## 2020-12-05 ENCOUNTER — Other Ambulatory Visit: Payer: Self-pay

## 2020-12-05 ENCOUNTER — Encounter: Payer: Self-pay | Admitting: Physical Therapy

## 2020-12-05 ENCOUNTER — Ambulatory Visit: Payer: BC Managed Care – PPO | Attending: Neurology | Admitting: Physical Therapy

## 2020-12-05 DIAGNOSIS — R42 Dizziness and giddiness: Secondary | ICD-10-CM | POA: Diagnosis present

## 2020-12-05 DIAGNOSIS — M542 Cervicalgia: Secondary | ICD-10-CM | POA: Insufficient documentation

## 2020-12-05 DIAGNOSIS — R2681 Unsteadiness on feet: Secondary | ICD-10-CM | POA: Diagnosis present

## 2020-12-06 ENCOUNTER — Ambulatory Visit (INDEPENDENT_AMBULATORY_CARE_PROVIDER_SITE_OTHER): Payer: BC Managed Care – PPO | Admitting: Psychology

## 2020-12-06 DIAGNOSIS — F411 Generalized anxiety disorder: Secondary | ICD-10-CM | POA: Diagnosis not present

## 2020-12-06 NOTE — Therapy (Signed)
[facility] [address] [place], [place], [address] Phone: [phone]   Fax:  [phone]  Physical Therapy Evaluation  Patient Details  Name: Christine Carey MRN: [id number] Date of Birth: Mar 31, 1986 Referring Provider (PT): Shon Millet, DO   Encounter Date: 12/05/2020   PT End of Session - 12/06/20 2130    Visit Number 1    Number of Visits 8   12 requested; 2x/week x 4 weeks; 1x/wk x 4 = 12   Date for PT Re-Evaluation 02/03/21    Authorization Type BCBS    PT Start Time 1020    PT Stop Time 1103    PT Time Calculation (min) 43 min    Activity Tolerance Patient tolerated treatment well    Behavior During Therapy Anxious           Past Medical History:  Diagnosis Date  . Anxiety   . Chronic tension headaches   . GAD (generalized anxiety disorder)   . Mild tricuspid regurgitation   . Panic attacks   . Seasonal allergies   . Somatic dysfunction of cervical region   . Tinnitus   . Vertigo     Past Surgical History:  Procedure Laterality Date  . WISDOM TOOTH EXTRACTION      There were no vitals filed for this visit.        [facility] PT Assessment - 12/06/20 0001      Assessment   Medical Diagnosis Vertigo    Referring Provider (PT) Shon Millet, DO    Onset Date/Surgical Date --   March 2021   Prior Therapy pt has had PT for cervical tightness; has seen chiropractor for adjustments (cervical region) and for dry needling      Balance Screen   Has the patient fallen in the past 6 months No    Has the patient had a decrease in activity level because of a fear of falling?  Yes    Is the patient reluctant to leave their home because of a fear of falling?  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  Pt reports she has had 2 severe episodes (1 week apart) in March; states she has anxiety because of the fear of having an attack of dizziness.  Is unable to lie down without dizziness and is unable to lie prone - states she is currently sleeping in a chair with 3 pillows      Symptom Behavior   Type of Dizziness  Oscillopsia;Spinning;Unsteady with head/body turns;"Funny feeling in head";Comment   when lies prone feels the world moves   Frequency of Dizziness daily    Duration of Dizziness varies    Symptom Nature Motion provoked;Variable    Aggravating Factors Sitting with head tilted back    Relieving Factors Rest;Medication    Progression of Symptoms Better    History of  similar episodes 2 episodes in March - 1 week apart      Oculomotor Exam   Oculomotor Alignment Normal    Spontaneous Absent    Smooth Pursuits Comment   nystagmus noted with horizontal smooth pursuits   Saccades Intact              Objective measurements completed on examination: See above findings.               PT Education - 12/06/20 2127    Education Details educated pt in concept of habituation - instructed pt to try lying/sleeping in chair, if only for short time during the night.  Also instructed pt to incorporate cardio exercise, such as walking program into her regular exercise program.  Pt verbalized understanding.    Person(s) Educated Patient    Methods Explanation    Comprehension Verbalized understanding            PT Short Term Goals - 12/06/20 2147      PT SHORT TERM GOAL #1   Title Pt will subjectively report at least 25% improvement in dizziness.    Time 4    Period Weeks    Status New     Target Date 01/06/21      PT SHORT TERM GOAL #2   Title Pt will tolerate lying on stomach for at least 15" with dizziness </= 3/10 intensity.    Time 4    Period Weeks    Status New    Target Date 01/06/21      PT SHORT TERM GOAL #3   Title Pt will report ability to sleep in her bed (rather than recliner) for at least 4 hours/night.    Time 4    Period Weeks    Status New    Target Date 01/06/21      PT SHORT TERM GOAL #4   Title Independent in HEP for vestibular exercises.    Time 4    Period Weeks    Status New    Target Date 01/06/21             PT Long Term Goals - 12/06/20 2150      PT LONG TERM GOAL #1   Title Pt will improve DHI score from 78% to </= 30% for improved quality of life.    Baseline 78%    Time 8    Period Weeks    Status New    Target Date 02/03/21      PT LONG TERM GOAL #2   Title Increase FOTO score from 68/100 to >/= 78/100 to demo improvement in dizziness.    Time 8    Period Weeks    Status New    Target Date 02/03/21      PT LONG TERM GOAL #3   Title Perform DVA test and establish goal as appropriate.    Time 8    Period Weeks    Status New    Target Date 02/03/21      PT LONG TERM GOAL #4   Title Pt will report ability to sleep in bed duration of the night, rather than in recliner for improved sleep hygiene.    Time 8    Period Weeks    Status New    Target Date 02/03/21      PT LONG TERM GOAL #5   Title Pt will report ability to lie prone without severe increase in dizziness for preferred sleeping position.  Time 8    Period Weeks    Status New    Target Date 02/03/21      Additional Long Term Goals   Additional Long Term Goals Yes      PT LONG TERM GOAL #6   Title Independent in updated HEP for balance and vestibular exercises.    Time 8    Period Weeks    Status New    Target Date 02/03/21                  Plan - 12/06/20 2134    Clinical Impression Statement Pt is a 34 yr old lady with h/o  episodic vertigo of unknown etiology.  Some mild frog jumping nystagmus was noted with horizontal smooth pursuits but pt reported only mild increased dizziness with testing.  Positional testing was deferred due to time constraints and will be completed next session.  Pt does have much anxiety as result of dizziness.  Pt also reports chronic tension headaches and reports feeling that a band is around her head; describes discomfort and tightness at times at occiput.  Pt has been referred to dentist for TMJ problems (referred by her ENT, Dr. Doran Heater); pt may be grinding due to anxiety due to dizziness which is resulting in TMJ problems.  Vestibular testing will be completed next session.    Personal Factors and Comorbidities Comorbidity 2;Behavior Pattern;Past/Current Experience    Comorbidities GAD, chronic tension headaches, panic attacks, tinnitus, vertigo, cervical dysfunction    Examination-Activity Limitations Locomotion Level;Bed Mobility;Squat;Stand;Caring for Others;Transfers    Examination-Participation Restrictions Meal Prep;Cleaning;Yard Work;Laundry;Shop;Community Activity    Clinical Decision Making Moderate    Rehab Potential Good    PT Frequency 2x / week   decrease to 1x/wk for 4 weeks after 1st month   PT Duration 8 weeks   8 weeks total   PT Treatment/Interventions ADLs/Self Care Home Management;Vestibular;Canalith Repostioning;Therapeutic exercise;Therapeutic activities;Gait training;Balance training;Neuromuscular re-education;Dry needling;Patient/family education;Manual techniques    PT Next Visit Plan complete vestibular exam - do DVA, positional testing, MSQ    Consulted and Agree with Plan of Care Patient           Patient will benefit from skilled therapeutic intervention in order to improve the following deficits and impairments:  Difficulty walking,Dizziness,Decreased balance,Pain,Decreased activity tolerance  Visit Diagnosis: Dizziness and giddiness - Plan: PT plan of  care cert/re-cert  Cervicalgia - Plan: PT plan of care cert/re-cert  Unsteadiness on feet - Plan: PT plan of care cert/re-cert     Problem List Patient Active Problem List   Diagnosis Date Noted  . IUD (intrauterine device) in place - May 2018, Mirena 04/23/2018  . GAD (generalized anxiety disorder) 04/23/2018    Nayden Czajka, Donavan Burnet, PT 12/06/2020, 10:08 PM  [facility] [address] [place], [place], [address] Phone: [phone]   Fax:  [phone]  Name: Christine Carey MRN: [id number] Date of Birth: 1986/09/12

## 2020-12-08 ENCOUNTER — Ambulatory Visit: Payer: BC Managed Care – PPO | Admitting: Physical Therapy

## 2020-12-08 ENCOUNTER — Other Ambulatory Visit: Payer: Self-pay

## 2020-12-08 ENCOUNTER — Encounter: Payer: Self-pay | Admitting: Physical Therapy

## 2020-12-08 DIAGNOSIS — R2681 Unsteadiness on feet: Secondary | ICD-10-CM

## 2020-12-08 DIAGNOSIS — R42 Dizziness and giddiness: Secondary | ICD-10-CM

## 2020-12-08 NOTE — Patient Instructions (Addendum)
  Feet Apart (Compliant Surface) Head Motion - Eyes Closed    Stand on compliant surface: __pillow______ with feet shoulder width apart. Close eyes and move head slowly, up and down. Repeat __10__ times per session. Do __1__ sessions per day.  Copyright  VHI. All rights reserved.    Gaze Stabilization: Tip Card  1.Target must remain in focus, not blurry, and appear stationary while head is in motion. 2.Perform exercises with small head movements (45 to either side of midline). 3.Increase speed of head motion so long as target is in focus. 4.If you wear eyeglasses, be sure you can see target through lens (therapist will give specific instructions for bifocal / progressive lenses). 5.These exercises may provoke dizziness or nausea. Work through these symptoms. If too dizzy, slow head movement slightly. Rest between each exercise. 6.Exercises demand concentration; avoid distractions. 7.For safety, perform standing exercises close to a counter, wall, corner, or next to someone.  Copyright  VHI. All rights reserved.    Gaze Stabilization: Standing Feet Apart    Feet shoulder width apart, keeping eyes on target on wall __6__ feet away, tilt head down 15-30 and move head side to side for _60_ seconds. Repeat while moving head up and down for _60___ seconds. Do __3-5__ sessions per day. Repeat using target on pattern background.  Copyright  VHI. All rights reserved.

## 2020-12-09 ENCOUNTER — Encounter: Payer: Self-pay | Admitting: Physical Therapy

## 2020-12-09 NOTE — Therapy (Signed)
[facility] [address] [place], [place], [address] Phone: [phone]   Fax:  [phone]  Physical Therapy Treatment  Patient Details  Name: Christine Carey MRN: [id number] Date of Birth: Feb 20, 1986 Referring Provider (PT): Shon Millet, DO   Encounter Date: 12/08/2020   PT End of Session - 12/09/20 1420    Visit Number 2    Number of Visits 8   12 requested; 2x/week x 4 weeks; 1x/wk x 4 = 12   Date for PT Re-Evaluation 02/03/21    Authorization Type BCBS    PT Start Time 0850    PT Stop Time 0933    PT Time Calculation (min) 43 min    Activity Tolerance Patient tolerated treatment well    Behavior During Therapy Anxious;WFL for tasks assessed/performed           Past Medical History:  Diagnosis Date  . Anxiety   . Chronic tension headaches   . GAD (generalized anxiety disorder)   . Mild tricuspid regurgitation   . Panic attacks   . Seasonal allergies   . Somatic dysfunction of cervical region   . Tinnitus   . Vertigo     Past Surgical History:  Procedure Laterality Date  . WISDOM TOOTH EXTRACTION      There were no vitals filed for this visit.   Subjective Assessment - 12/08/20 0852    Subjective Pt states she tried lying down in bed with 3 pillows and had nausea after 15"- 20", so she got up; states she is trying to do more head turns when she is walking    Pertinent History occasional tinnitus,  nausea and vomiting lasted for approx. 3 months at onset of vertiginous episode; chronic tension headaches, generalized anxiety disorder, panic attacks    Patient Stated Goals to be able to sleep lying down again (is currently sleeping in a recliner); used no pillows before March 2021    Currently in Pain? No/denies                   Vestibular Assessment - 12/09/20 0001      Visual Acuity   Static line 10    Dynamic line 9   c/o increased dizziness upon completion of test     Positional  Sensitivities   Sit to Supine Mild dizziness    Supine to Left Side Mild dizziness    Supine to Right Side Mild dizziness    Supine to Sitting Mild dizziness    Head Turning x 5 Mild dizziness    Head Nodding x 5 Mild dizziness    Rolling Right Mild dizziness   worse to Rt side   Rolling Left Mild dizziness    Positional Sensitivities Comments appear to be worse with movements toward Rt side          Self Care; discussed STG's & LTG's with patient; informed pt of FOTO scores with DHI 78% - explained what DHI is And meaning/interpretation of her score which indicates severe handicap based on the severity of the dizziness            Vestibular Treatment/Exercise - 12/09/20 0001      Vestibular Treatment/Exercise   Gaze Exercises X1 Viewing Horizontal;X1 Viewing Vertical      X1 Viewing Horizontal   Foot Position bil. stance    Time --   60 secs   Reps 1    Comments min c/o increased dizziness      X1  Viewing Vertical   Foot Position bil. stance    Time --   60 secs   Reps 1    Comments min c/o increased dizziness              Balance Exercises - 12/09/20 0001      Balance Exercises: Standing   Standing Eyes Opened Narrow base of support (BOS);Wide (BOA);Head turns;Foam/compliant surface;5 reps   horizontal and vertical head turns   Standing Eyes Closed Narrow base of support (BOS);Wide (BOA);Head turns;Foam/compliant surface;5 reps   horizontal and vertical head turns            PT Education - 12/09/20 1419    Education Details x1 viewing and balance on foam    Person(s) Educated Patient    Methods Explanation;Demonstration;Handout    Comprehension Verbalized understanding;Returned demonstration            PT Short Term Goals - 12/09/20 1432      PT SHORT TERM GOAL #1   Title Pt will subjectively report at least 25% improvement in dizziness.    Time 4    Period Weeks    Status New    Target Date 01/06/21      PT SHORT TERM GOAL #2   Title  Pt will tolerate lying on stomach for at least 15" with dizziness </= 3/10 intensity.    Time 4    Period Weeks    Status New    Target Date 01/06/21      PT SHORT TERM GOAL #3   Title Pt will report ability to sleep in her bed (rather than recliner) for at least 4 hours/night.    Time 4    Period Weeks    Status New    Target Date 01/06/21      PT SHORT TERM GOAL #4   Title Independent in HEP for vestibular exercises.    Time 4    Period Weeks    Status New    Target Date 01/06/21             PT Long Term Goals - 12/09/20 1432      PT LONG TERM GOAL #1   Title Pt will improve DHI score from 78% to </= 30% for improved quality of life.    Baseline 78%    Time 8    Period Weeks    Status New      PT LONG TERM GOAL #2   Title Increase FOTO score from 68/100 to >/= 78/100 to demo improvement in dizziness.    Time 8    Period Weeks    Status New      PT LONG TERM GOAL #3   Title Perform DVA test and establish goal as appropriate.    Time 8    Period Weeks    Status New      PT LONG TERM GOAL #4   Title Pt will report ability to sleep in bed duration of the night, rather than in recliner for improved sleep hygiene.    Time 8    Period Weeks    Status New      PT LONG TERM GOAL #5   Title Pt will report ability to lie prone without severe increase in dizziness for preferred sleeping position.    Time 8    Period Weeks    Status New      PT LONG TERM GOAL #6   Title Independent in updated HEP for balance  and vestibular exercises.    Time 8    Period Weeks    Status New                 Plan - 12/09/20 1421    Clinical Impression Statement Pt was educated in concept of habituation and performing exercises which provoked dizziness to tolerance to prevent going over "threshold" to avoid feelings of nausea and increased dizziness which results in much anxiety and stress. Pt's DVA is WNL's with only 1 line difference with pt reporting some mild increased  dizziness upon completion of test.  Pt reports she is trying to make herself lie in positions that provoke symptoms, however, only able to tolerate for approx. 15"-20". Cont with POC.    Personal Factors and Comorbidities Comorbidity 2;Behavior Pattern;Past/Current Experience    Comorbidities GAD, chronic tension headaches, panic attacks, tinnitus, vertigo, cervical dysfunction    Examination-Activity Limitations Locomotion Level;Bed Mobility;Squat;Stand;Caring for Others;Transfers    Examination-Participation Restrictions Meal Prep;Cleaning;Yard Work;Laundry;Shop;Community Activity    Rehab Potential Good    PT Frequency 2x / week   decrease to 1x/wk for 4 weeks after 1st month   PT Duration 8 weeks   8 weeks total   PT Treatment/Interventions ADLs/Self Care Home Management;Vestibular;Canalith Repostioning;Therapeutic exercise;Therapeutic activities;Gait training;Balance training;Neuromuscular re-education;Dry needling;Patient/family education;Manual techniques    PT Next Visit Plan do SOT    PT Home Exercise Plan x1 viewing, balance on foam    Consulted and Agree with Plan of Care Patient           Patient will benefit from skilled therapeutic intervention in order to improve the following deficits and impairments:  Difficulty walking,Dizziness,Decreased balance,Pain,Decreased activity tolerance  Visit Diagnosis: Dizziness and giddiness  Unsteadiness on feet     Problem List Patient Active Problem List   Diagnosis Date Noted  . IUD (intrauterine device) in place - May 2018, Mirena 04/23/2018  . GAD (generalized anxiety disorder) 04/23/2018    Resha Filippone, Donavan Burnet, PT 12/09/2020, 2:34 PM   [facility] [address] [place], [place], [address] Phone: [phone]   Fax:  [phone]  Name: Sharise Lippy MRN: [id number] Date of Birth: 1986-05-20

## 2020-12-12 ENCOUNTER — Encounter: Payer: Self-pay | Admitting: Physical Therapy

## 2020-12-12 ENCOUNTER — Other Ambulatory Visit: Payer: Self-pay

## 2020-12-12 ENCOUNTER — Ambulatory Visit: Payer: BC Managed Care – PPO | Admitting: Physical Therapy

## 2020-12-12 DIAGNOSIS — R42 Dizziness and giddiness: Secondary | ICD-10-CM | POA: Diagnosis not present

## 2020-12-12 DIAGNOSIS — R2681 Unsteadiness on feet: Secondary | ICD-10-CM

## 2020-12-12 NOTE — Therapy (Signed)
[facility] [address] [place], [place], [address] Phone: [phone]   Fax:  [phone]  Physical Therapy Treatment  Patient Details  Name: Christine Carey MRN: [id number] Date of Birth: 1986-04-25 Referring Provider (PT): Shon Millet, DO   Encounter Date: 12/12/2020   PT End of Session - 12/12/20 2008    Visit Number 3    Number of Visits 8   12 requested; 2x/week x 4 weeks; 1x/wk x 4 = 12   Date for PT Re-Evaluation 02/03/21    Authorization Type BCBS    PT Start Time 0855    PT Stop Time 0936    PT Time Calculation (min) 41 min    Equipment Utilized During Treatment Other (comment)   harness used on SOT   Activity Tolerance Patient tolerated treatment well    Behavior During Therapy [facility] for tasks assessed/performed           Past Medical History:  Diagnosis Date  . Anxiety   . Chronic tension headaches   . GAD (generalized anxiety disorder)   . Mild tricuspid regurgitation   . Panic attacks   . Seasonal allergies   . Somatic dysfunction of cervical region   . Tinnitus   . Vertigo     Past Surgical History:  Procedure Laterality Date  . WISDOM TOOTH EXTRACTION      There were no vitals filed for this visit.   Subjective Assessment - 12/12/20 1955    Subjective Pt states she slept in the bed on Friday night but did not take Xanax and felt really bad all day Saturday; pt asks if she should take the Xanax and sleep in bed rather than recliner of is the Xanax suppressing her vestibular system    Pertinent History occasional tinnitus,  nausea and vomiting lasted for approx. 3 months at onset of vertiginous episode; chronic tension headaches, generalized anxiety disorder, panic attacks    Patient Stated Goals to be able to sleep lying down again (is currently sleeping in a recliner); used no pillows before March 2021                   Vestibular Assessment - 12/12/20 0001      Balancemaster    Hospital doctor Comment composite score 79/100 with N= 70/100 (WNL's)          Sensory Organization Test; composite score 79/100: N= 70/100  Somatosensory, visual and vestibular inputs are all WNL's  Condition 1 - all 3 trials WNL's Condition 2 - trials 1 and 3 WNL's;  Trial 2 slightly below N Condition 3 - trial 1 decreased:  Trials 2 and 3 WNL's Condition 4 - all 3 trials WNL's Condition 5 - all 3 trials WNL's Condition 6 - all 3 trials WNL's  Pt had no significant increase in symptoms when test completed and stepping off of Balance Master        [facility] Adult PT Treatment/Exercise - 12/12/20 0001      Self-Care   Self-Care Other Self-Care Comments    Other Self-Care Comments  discussed recommendation for BioFreeze for upper trap and posterior cervical region      Exercises   Exercises Neck      Neck Exercises: Seated   Lateral Flexion Right;Left;Other reps (comment)   2 reps each side - 5 sec hold     Neck Exercises: Supine   Neck Retraction 5 reps;3 secs   3 sec  hold                 PT Education - 12/12/20 2006    Education Details cervical retraction and lateral flexion added to HEP    Person(s) Educated Patient    Methods Explanation    Comprehension Verbalized understanding            PT Short Term Goals - 12/12/20 2017      PT SHORT TERM GOAL #1   Title Pt will subjectively report at least 25% improvement in dizziness.    Time 4    Period Weeks    Status New    Target Date 01/06/21      PT SHORT TERM GOAL #2   Title Pt will tolerate lying on stomach for at least 15" with dizziness </= 3/10 intensity.    Time 4    Period Weeks    Status New    Target Date 01/06/21      PT SHORT TERM GOAL #3   Title Pt will report ability to sleep in her bed (rather than recliner) for at least 4 hours/night.    Time 4    Period Weeks    Status New    Target Date 01/06/21      PT SHORT TERM GOAL #4   Title  Independent in HEP for vestibular exercises.    Time 4    Period Weeks    Status New    Target Date 01/06/21             PT Long Term Goals - 12/12/20 2018      PT LONG TERM GOAL #1   Title Pt will improve DHI score from 78% to </= 30% for improved quality of life.    Baseline 78%    Time 8    Period Weeks    Status New      PT LONG TERM GOAL #2   Title Increase FOTO score from 68/100 to >/= 78/100 to demo improvement in dizziness.    Time 8    Period Weeks    Status New      PT LONG TERM GOAL #3   Title Perform DVA test and establish goal as appropriate.    Baseline 1 line difference on 12-08-20 (WNL's)    Time 8    Period Weeks    Status Achieved      PT LONG TERM GOAL #4   Title Pt will report ability to sleep in bed duration of the night, rather than in recliner for improved sleep hygiene.    Time 8    Period Weeks    Status New      PT LONG TERM GOAL #5   Title Pt will report ability to lie prone without severe increase in dizziness for preferred sleeping position.    Time 8    Period Weeks    Status New      PT LONG TERM GOAL #6   Title Independent in updated HEP for balance and vestibular exercises.    Time 8    Period Weeks    Status New                 Plan - 12/12/20 2010    Clinical Impression Statement Pt's SOT score is WNL's with composite score 79/100 and N=70/100.  Somatosensory, visual and vestibular inputs are all WNL's.  COG is aligned correctly.  No vestibular hypofunction per SOT.  Pt continues to report  increased dizziness with lying supine and on her right side but states dizziness fluctuates and states "today is a good day". Cont with POC.    Personal Factors and Comorbidities Comorbidity 2;Behavior Pattern;Past/Current Experience    Comorbidities GAD, chronic tension headaches, panic attacks, tinnitus, vertigo, cervical dysfunction    Examination-Activity Limitations Locomotion Level;Bed Mobility;Squat;Stand;Caring for  Others;Transfers    Examination-Participation Restrictions Meal Prep;Cleaning;Yard Work;Laundry;Shop;Community Activity    Rehab Potential Good    PT Frequency 2x / week   decrease to 1x/wk for 4 weeks after 1st month   PT Duration 8 weeks   8 weeks total   PT Treatment/Interventions ADLs/Self Care Home Management;Vestibular;Canalith Repostioning;Therapeutic exercise;Therapeutic activities;Gait training;Balance training;Neuromuscular re-education;Dry needling;Patient/family education;Manual techniques    PT Next Visit Plan review HEP - cervical stretches, balance on foam and x1 viewing    PT Home Exercise Plan x1 viewing, balance on foam    Consulted and Agree with Plan of Care Patient           Patient will benefit from skilled therapeutic intervention in order to improve the following deficits and impairments:  Difficulty walking,Dizziness,Decreased balance,Pain,Decreased activity tolerance  Visit Diagnosis: Dizziness and giddiness  Unsteadiness on feet     Problem List Patient Active Problem List   Diagnosis Date Noted  . IUD (intrauterine device) in place - May 2018, Mirena 04/23/2018  . GAD (generalized anxiety disorder) 04/23/2018    Christine Carey, Donavan Burnet, PT 12/12/2020, 8:22 PM  [facility] [address] [place], [place], [address] Phone: [phone]   Fax:  [phone]  Name: Christine Carey MRN: [id number] Date of Birth: May 11, 1986

## 2020-12-13 ENCOUNTER — Ambulatory Visit: Payer: BC Managed Care – PPO | Admitting: Physical Therapy

## 2020-12-13 ENCOUNTER — Ambulatory Visit (INDEPENDENT_AMBULATORY_CARE_PROVIDER_SITE_OTHER): Payer: BC Managed Care – PPO | Admitting: Psychology

## 2020-12-13 DIAGNOSIS — R42 Dizziness and giddiness: Secondary | ICD-10-CM

## 2020-12-13 DIAGNOSIS — F411 Generalized anxiety disorder: Secondary | ICD-10-CM | POA: Diagnosis not present

## 2020-12-13 DIAGNOSIS — M542 Cervicalgia: Secondary | ICD-10-CM

## 2020-12-14 ENCOUNTER — Encounter: Payer: Self-pay | Admitting: Physical Therapy

## 2020-12-14 NOTE — Therapy (Signed)
[facility] [address] [place], [place], [address] Phone: [phone]   Fax:  [phone]  Physical Therapy Treatment  Patient Details  Name: Christine Carey MRN: [id number] Date of Birth: 11/22/1986 Referring Provider (PT): Shon Millet, DO   Encounter Date: 12/13/2020   PT End of Session - 12/14/20 1704    Visit Number 4    Number of Visits 8   12 requested; 2x/week x 4 weeks; 1x/wk x 4 = 12   Date for PT Re-Evaluation 02/03/21    Authorization Type BCBS    PT Start Time 0806    PT Stop Time 0852    PT Time Calculation (min) 46 min    Equipment Utilized During Treatment --    Activity Tolerance Patient tolerated treatment well    Behavior During Therapy [facility] for tasks assessed/performed           Past Medical History:  Diagnosis Date  . Anxiety   . Chronic tension headaches   . GAD (generalized anxiety disorder)   . Mild tricuspid regurgitation   . Panic attacks   . Seasonal allergies   . Somatic dysfunction of cervical region   . Tinnitus   . Vertigo     Past Surgical History:  Procedure Laterality Date  . WISDOM TOOTH EXTRACTION      There were no vitals filed for this visit.      NeuroRe-ed; Habituation - pt lied prone x 1" then transferred to sitting  Lying supine - head turns to Rt and Lt sides; rolling onto Rt side and then onto Lt side Pt reported more dizziness with lying on Rt side than on Lt side  Pt performed quadruped position - performed head turns side to side and flexion/extension 10 reps each direction  Performed modified plank position (on thighs) x 30 sec hold then performed Propped on forearms x 30 sec hold Performed lying prone for 2nd rep x 1" - c/o nauseousness after this 2nd rep                [facility] Adult PT Treatment/Exercise - 12/14/20 0001      Exercises   Exercises Neck      Neck Exercises: Seated   Lateral Flexion Right;Left;Other reps (comment)    2 reps each side - 5 sec hold   Shoulder Rolls Backwards;Forwards;5 reps   5 reps each direction   Other Seated Exercise cervical flexion and extension 10 reps      Neck Exercises: Supine   Neck Retraction 5 reps;3 secs      Neck Exercises: Stretches   Upper Trapezius Stretch Right;Left;1 rep;30 seconds   lateral flexor stretch with opposite arm outstretched   Neck Stretch 1 rep;30 seconds   cervical rotation with towel to Rt & Lt sides          Vestibular Treatment/Exercise - 12/14/20 0001      Vestibular Treatment/Exercise   Gaze Exercises X1 Viewing Horizontal;X1 Viewing Vertical      X1 Viewing Horizontal   Foot Position bil. stance    Time --   30 secs   Reps 1   plain background   Comments pt reported blurring after 30 secs so ex. was stopped      X1 Viewing Vertical   Foot Position bil. stance    Time --   60 secs   Reps 1   plain background   Comments min c/o increased dizziness  PT Education - 12/14/20 1659    Education Details cervical stretches, balance on foam with feet together with EO and EC    Person(s) Educated Patient    Methods Explanation;Demonstration;Handout    Comprehension Verbalized understanding;Returned demonstration            PT Short Term Goals - 12/14/20 1715      PT SHORT TERM GOAL #1   Title Pt will subjectively report at least 25% improvement in dizziness.    Time 4    Period Weeks    Status New    Target Date 01/06/21      PT SHORT TERM GOAL #2   Title Pt will tolerate lying on stomach for at least 15" with dizziness </= 3/10 intensity.    Time 4    Period Weeks    Status New    Target Date 01/06/21      PT SHORT TERM GOAL #3   Title Pt will report ability to sleep in her bed (rather than recliner) for at least 4 hours/night.    Time 4    Period Weeks    Status New    Target Date 01/06/21      PT SHORT TERM GOAL #4   Title Independent in HEP for vestibular exercises.    Time 4    Period Weeks     Status New    Target Date 01/06/21             PT Long Term Goals - 12/14/20 1716      PT LONG TERM GOAL #1   Title Pt will improve DHI score from 78% to </= 30% for improved quality of life.    Baseline 78%    Time 8    Period Weeks    Status New      PT LONG TERM GOAL #2   Title Increase FOTO score from 68/100 to >/= 78/100 to demo improvement in dizziness.    Time 8    Period Weeks    Status New      PT LONG TERM GOAL #3   Title Perform DVA test and establish goal as appropriate.    Baseline 1 line difference on 12-08-20 (WNL's)    Time 8    Period Weeks    Status Achieved      PT LONG TERM GOAL #4   Title Pt will report ability to sleep in bed duration of the night, rather than in recliner for improved sleep hygiene.    Time 8    Period Weeks    Status New      PT LONG TERM GOAL #5   Title Pt will report ability to lie prone without severe increase in dizziness for preferred sleeping position.    Time 8    Period Weeks    Status New      PT LONG TERM GOAL #6   Title Independent in updated HEP for balance and vestibular exercises.    Time 8    Period Weeks    Status New                 Plan - 12/14/20 1708    Clinical Impression Statement Pt able to tolerate lying on stomach for habituation 1" x 2 reps with pt reporting feeling nauseous after 2nd rep.  Pt able to perform x1 viewing horizontal for 30 secs prior to increased symptoms and able to perform x1 viewing vertical for 60 secs.  Pt  slowly improving with activities for habituation. Pt does continue to c/o pressure and tightness from occiput to front of head in forehead area. Cont with POC.    Personal Factors and Comorbidities Comorbidity 2;Behavior Pattern;Past/Current Experience    Comorbidities GAD, chronic tension headaches, panic attacks, tinnitus, vertigo, cervical dysfunction    Examination-Activity Limitations Locomotion Level;Bed Mobility;Squat;Stand;Caring for Others;Transfers     Examination-Participation Restrictions Meal Prep;Cleaning;Yard Work;Laundry;Shop;Community Activity    Rehab Potential Good    PT Frequency 2x / week   decrease to 1x/wk for 4 weeks after 1st month   PT Duration 8 weeks   8 weeks total   PT Treatment/Interventions ADLs/Self Care Home Management;Vestibular;Canalith Repostioning;Therapeutic exercise;Therapeutic activities;Gait training;Balance training;Neuromuscular re-education;Dry needling;Patient/family education;Manual techniques    PT Next Visit Plan review HEP - cervical stretches, balance on foam and x1 viewing    PT Home Exercise Plan x1 viewing, balance on foam    Consulted and Agree with Plan of Care Patient           Patient will benefit from skilled therapeutic intervention in order to improve the following deficits and impairments:  Difficulty walking,Dizziness,Decreased balance,Pain,Decreased activity tolerance  Visit Diagnosis: Dizziness and giddiness  Cervicalgia     Problem List Patient Active Problem List   Diagnosis Date Noted  . IUD (intrauterine device) in place - May 2018, Mirena 04/23/2018  . GAD (generalized anxiety disorder) 04/23/2018    Blessed Girdner, Donavan Burnet, PT 12/14/2020, 5:18 PM  [facility] [address] [place], [place], [address] Phone: [phone]   Fax:  [phone]  Name: Christine Carey MRN: [id number] Date of Birth: 1986/07/08

## 2020-12-20 ENCOUNTER — Ambulatory Visit: Payer: BC Managed Care – PPO | Admitting: Psychology

## 2020-12-21 ENCOUNTER — Ambulatory Visit (INDEPENDENT_AMBULATORY_CARE_PROVIDER_SITE_OTHER): Payer: BC Managed Care – PPO | Admitting: Psychology

## 2020-12-21 DIAGNOSIS — F411 Generalized anxiety disorder: Secondary | ICD-10-CM | POA: Diagnosis not present

## 2020-12-22 ENCOUNTER — Other Ambulatory Visit: Payer: Self-pay

## 2020-12-22 ENCOUNTER — Ambulatory Visit: Payer: BC Managed Care – PPO | Admitting: Physical Therapy

## 2020-12-22 DIAGNOSIS — R2681 Unsteadiness on feet: Secondary | ICD-10-CM

## 2020-12-22 DIAGNOSIS — R42 Dizziness and giddiness: Secondary | ICD-10-CM

## 2020-12-23 ENCOUNTER — Encounter: Payer: Self-pay | Admitting: Physical Therapy

## 2020-12-23 NOTE — Therapy (Signed)
[facility] [address] [place], [place], [address] Phone: [phone]   Fax:  [phone]  Physical Therapy Treatment  Patient Details  Name: Christine Carey MRN: [id number] Date of Birth: 08-Jul-1986 Referring Provider (PT): Shon Millet, DO   Encounter Date: 12/22/2020   PT End of Session - 12/23/20 1349    Visit Number 5    Number of Visits 8   12 requested; 2x/week x 4 weeks; 1x/wk x 4 = 12   Date for PT Re-Evaluation 02/03/21    Authorization Type BCBS    PT Start Time 1150    PT Stop Time 1233    PT Time Calculation (min) 43 min    Activity Tolerance Patient tolerated treatment well    Behavior During Therapy [facility] for tasks assessed/performed           Past Medical History:  Diagnosis Date  . Anxiety   . Chronic tension headaches   . GAD (generalized anxiety disorder)   . Mild tricuspid regurgitation   . Panic attacks   . Seasonal allergies   . Somatic dysfunction of cervical region   . Tinnitus   . Vertigo     Past Surgical History:  Procedure Laterality Date  . WISDOM TOOTH EXTRACTION      There were no vitals filed for this visit.   Subjective Assessment - 12/22/20 1154    Subjective Pt states she feels she is doing a little better; intentionally slept without her mouth splint last night and woke up with some TMJ discomfort (realizing the splint does help as she clinches and grinds teeth in her sleep); states she went to [employer] and was able to look up and reach up as she was purchasing some shampoo and states she has been unable to do this in the past due to dizziness/anxiety    Pertinent History occasional tinnitus,  nausea and vomiting lasted for approx. 3 months at onset of vertiginous episode; chronic tension headaches, generalized anxiety disorder, panic attacks    Patient Stated Goals to be able to sleep lying down again (is currently sleeping in a recliner); used no pillows before March 2021     Currently in Pain? No/denies                   Pt performed quadruped position - performed head turns side to side and flexion/extension 10 reps each direction  Performed modified plank position (on thighs) x 30 sec hold then performed Propped on forearms x 30 sec hold Performed lying prone for 2 reps x 1" - c/o mild nauseau after 2nd rep               Vestibular Treatment/Exercise - 12/23/20 0001      Vestibular Treatment/Exercise   Gaze Exercises X1 Viewing Horizontal;X1 Viewing Vertical   plain background     X1 Viewing Horizontal   Foot Position bil. stance    Time --   60 secs   Reps 1      X1 Viewing Vertical   Foot Position bil. stance    Time --   60 secs   Reps 1              Balance Exercises - 12/23/20 0001      Balance Exercises: Standing   Standing Eyes Opened Narrow base of support (BOS);Wide (BOA);Head turns;Foam/compliant surface;5 reps   horizontal and vertical head turns   Standing Eyes Closed Narrow base of support (BOS);Wide (BOA);Head  turns;Foam/compliant surface;5 reps   horizontal and vertical head turns   Other Standing Exercises pt performed standing gaze stabiization exercise - standing on pillows - tracking ball clockwise and counterclockwise 5 reps each direction    Other Standing Exercises Comments amb. moving target side to side tracking with eyes and head moving               PT Short Term Goals - 12/23/20 1354      PT SHORT TERM GOAL #1   Title Pt will subjectively report at least 25% improvement in dizziness.    Time 4    Period Weeks    Status New    Target Date 01/06/21      PT SHORT TERM GOAL #2   Title Pt will tolerate lying on stomach for at least 15" with dizziness </= 3/10 intensity.    Time 4    Period Weeks    Status New    Target Date 01/06/21      PT SHORT TERM GOAL #3   Title Pt will report ability to sleep in her bed (rather than recliner) for at least 4 hours/night.    Time 4     Period Weeks    Status New    Target Date 01/06/21      PT SHORT TERM GOAL #4   Title Independent in HEP for vestibular exercises.    Time 4    Period Weeks    Status New    Target Date 01/06/21             PT Long Term Goals - 12/23/20 1354      PT LONG TERM GOAL #1   Title Pt will improve DHI score from 78% to </= 30% for improved quality of life.    Baseline 78%    Time 8    Period Weeks    Status New      PT LONG TERM GOAL #2   Title Increase FOTO score from 68/100 to >/= 78/100 to demo improvement in dizziness.    Time 8    Period Weeks    Status New      PT LONG TERM GOAL #3   Title Perform DVA test and establish goal as appropriate.    Baseline 1 line difference on 12-08-20 (WNL's)    Time 8    Period Weeks    Status Achieved      PT LONG TERM GOAL #4   Title Pt will report ability to sleep in bed duration of the night, rather than in recliner for improved sleep hygiene.    Time 8    Period Weeks    Status New      PT LONG TERM GOAL #5   Title Pt will report ability to lie prone without severe increase in dizziness for preferred sleeping position.    Time 8    Period Weeks    Status New      PT LONG TERM GOAL #6   Title Independent in updated HEP for balance and vestibular exercises.    Time 8    Period Weeks    Status New                 Plan - 12/23/20 1349    Clinical Impression Statement Pt is progressing towards goals, however, continues to c/o nausea with lying in prone position for 1", > with head turned toward Rt side; pt able to tolerate 2 reps of  1" lying prone, 1 with head turned to Lt side and 1 rep with head turned to Rt side.  Pt able to perform standing on pillows with head turns without major LOB but reports feeling of dysequilibrium after exercise was completed.  Cont with POC    Personal Factors and Comorbidities Comorbidity 2;Behavior Pattern;Past/Current Experience    Comorbidities GAD, chronic tension headaches, panic  attacks, tinnitus, vertigo, cervical dysfunction    Examination-Activity Limitations Locomotion Level;Bed Mobility;Squat;Stand;Caring for Others;Transfers    Examination-Participation Restrictions Meal Prep;Cleaning;Yard Work;Laundry;Shop;Community Activity    Rehab Potential Good    PT Frequency 2x / week   decrease to 1x/wk for 4 weeks after 1st month   PT Duration 8 weeks   8 weeks total   PT Treatment/Interventions ADLs/Self Care Home Management;Vestibular;Canalith Repostioning;Therapeutic exercise;Therapeutic activities;Gait training;Balance training;Neuromuscular re-education;Dry needling;Patient/family education;Manual techniques    PT Next Visit Plan balance on foam and x1 viewing; add elliptical    PT Home Exercise Plan x1 viewing, balance on foam    Consulted and Agree with Plan of Care Patient           Patient will benefit from skilled therapeutic intervention in order to improve the following deficits and impairments:  Difficulty walking,Dizziness,Decreased balance,Pain,Decreased activity tolerance  Visit Diagnosis: Dizziness and giddiness  Unsteadiness on feet     Problem List Patient Active Problem List   Diagnosis Date Noted  . IUD (intrauterine device) in place - May 2018, Mirena 04/23/2018  . GAD (generalized anxiety disorder) 04/23/2018    Christine Carey, Christine Carey, PT 12/23/2020, 2:15 PM  [facility] [address] [place], [place], [address] Phone: [phone]   Fax:  [phone]  Name: Christine Carey MRN: [id number] Date of Birth: [birth year]

## 2020-12-27 ENCOUNTER — Ambulatory Visit: Payer: BC Managed Care – PPO | Admitting: Psychology

## 2020-12-27 ENCOUNTER — Encounter: Payer: Self-pay | Admitting: Physical Therapy

## 2020-12-27 ENCOUNTER — Other Ambulatory Visit: Payer: Self-pay

## 2020-12-27 ENCOUNTER — Ambulatory Visit: Payer: BC Managed Care – PPO | Attending: Neurology | Admitting: Physical Therapy

## 2020-12-27 DIAGNOSIS — R42 Dizziness and giddiness: Secondary | ICD-10-CM | POA: Insufficient documentation

## 2020-12-27 DIAGNOSIS — R2681 Unsteadiness on feet: Secondary | ICD-10-CM | POA: Diagnosis present

## 2020-12-27 NOTE — Therapy (Signed)
[facility] [address] [place], [place], [address] Phone: [phone]   Fax:  [phone]  Physical Therapy Treatment  Patient Details  Name: Christine Carey MRN: [id number] Date of Birth: [birth year] Referring Provider (PT): Shon Millet, DO   Encounter Date: 12/27/2020   PT End of Session - 12/27/20 1948    Visit Number 6    Number of Visits 8   12 requested; 2x/week x 4 weeks; 1x/wk x 4 = 12   Date for PT Re-Evaluation 02/03/21    Authorization Type BCBS    PT Start Time 0935    PT Stop Time 1017    PT Time Calculation (min) 42 min    Activity Tolerance Patient tolerated treatment well    Behavior During Therapy [facility] for tasks assessed/performed           Past Medical History:  Diagnosis Date  . Anxiety   . Chronic tension headaches   . GAD (generalized anxiety disorder)   . Mild tricuspid regurgitation   . Panic attacks   . Seasonal allergies   . Somatic dysfunction of cervical region   . Tinnitus   . Vertigo     Past Surgical History:  Procedure Laterality Date  . WISDOM TOOTH EXTRACTION      There were no vitals filed for this visit.   Subjective Assessment - 12/27/20 0937    Subjective Pt states she was able to sleep with only 1 pillow in bed last night- a huge improvement as she initially used 10 pillows, 5 pillows, and then 1 pillow last night; has resumed using the night splint for her TMJ problem; pt states she did yoga (with a DVD) - got dizzy at the end of session when she lied down on her back, but went back and made herself get in same position a few hours later and states she did not get dizzy that 2nd time - "which was great"    Pertinent History occasional tinnitus,  nausea and vomiting lasted for approx. 3 months at onset of vertiginous episode; chronic tension headaches, generalized anxiety disorder, panic attacks    Patient Stated Goals to be able to sleep lying down again (is currently  sleeping in a recliner); used no pillows before March 2021    Currently in Pain? No/denies                Static visual acuity - line 10 Dynamic visual acuity - line 9 (WNL's)   Pt performed jogging 35' forward x 2 reps  Amb. Tossing ball straight up 30' x 1 rep; then amb. Tossing ball on Rt side and then on Lt side 30' x 1 rep    TherAct: Activities on mini trampoline for vestibular stimulation;  Jumping 5 reps with EO ; 5 reps with EC x 2 sets  1/2 jumping jacks 5 reps EO;  EC 5 reps with close CGA for safety  Marching on trampoline with horizontal head turns with EO 5 reps;  With vertical head turns 5 reps with EO  (No UE support used on // bar with activities)    [facility] Adult PT Treatment/Exercise - 12/27/20 0001      Exercises   Exercises Knee/Hip      Knee/Hip Exercises: Aerobic   Elliptical level 3.0 x 3" (forward direction)           Vestibular Treatment/Exercise - 12/27/20 0001      Vestibular Treatment/Exercise   Vestibular Treatment Provided Habituation;Gaze  Habituation Exercises Comment   pt performed lying prone with head turned to Rt and Lt sides for 30 sec duration - no c/o dizziness with lying in this position   Gaze Exercises X1 Viewing Horizontal;X1 Viewing Vertical      X1 Viewing Horizontal   Foot Position bil. stance    Time --   60 secs plain background:  15 secs patterned background, then 30 secs patterned background on 2nd rep   Reps 2    Comments pt had no increase in symptoms with plain background;  minimal increase with patterned background (posture grid)      X1 Viewing Vertical   Foot Position bil. stance    Time --   60 secs plain background, 15 secs patterned background, 60 secs on 2nd rep   Reps 2    Comments pt reported sensation of the lines moving on the posture grid at end of 1" of x1 viewing vertical exercise                 PT Education - 12/27/20 1944    Education Details progressed x1 viewing ex to  patterned background;  progressed standing on pillow exs to feet together and/or in partial tandem stance    Person(s) Educated Patient    Methods Explanation;Demonstration    Comprehension Verbalized understanding;Returned demonstration            PT Short Term Goals - 12/27/20 1954      PT SHORT TERM GOAL #1   Title Pt will subjectively report at least 25% improvement in dizziness.    Time 4    Period Weeks    Status New    Target Date 01/06/21      PT SHORT TERM GOAL #2   Title Pt will tolerate lying on stomach for at least 15" with dizziness </= 3/10 intensity.    Time 4    Period Weeks    Status New    Target Date 01/06/21      PT SHORT TERM GOAL #3   Title Pt will report ability to sleep in her bed (rather than recliner) for at least 4 hours/night.    Time 4    Period Weeks    Status New    Target Date 01/06/21      PT SHORT TERM GOAL #4   Title Independent in HEP for vestibular exercises.    Time 4    Period Weeks    Status New    Target Date 01/06/21             PT Long Term Goals - 12/27/20 1955      PT LONG TERM GOAL #1   Title Pt will improve DHI score from 78% to </= 30% for improved quality of life.    Baseline 78%    Time 8    Period Weeks    Status New      PT LONG TERM GOAL #2   Title Increase FOTO score from 68/100 to >/= 78/100 to demo improvement in dizziness.    Time 8    Period Weeks    Status New      PT LONG TERM GOAL #3   Title Perform DVA test and establish goal as appropriate.    Baseline 1 line difference on 12-08-20 (WNL's)    Time 8    Period Weeks    Status Achieved      PT LONG TERM GOAL #4   Title Pt  will report ability to sleep in bed duration of the night, rather than in recliner for improved sleep hygiene.    Time 8    Period Weeks    Status New      PT LONG TERM GOAL #5   Title Pt will report ability to lie prone without severe increase in dizziness for preferred sleeping position.    Time 8    Period Weeks     Status New      PT LONG TERM GOAL #6   Title Independent in updated HEP for balance and vestibular exercises.    Time 8    Period Weeks    Status New                 Plan - 12/27/20 1949    Clinical Impression Statement Pt is progressing very well with habituation and gaze stabilization.  Pt is reporting increased ability for tolerating positions, i.e. prone and lying supine, with much less dizziness provoked.  Pt also reporting ability to perform habituation, rather than avoiding movements and positions that provoke dizziness and anxiety.  Pt reports tolerating sleeping in bed with only 1 pillow, rather than with several pillows to produce an inclined position.  Cont with POC.    Personal Factors and Comorbidities Comorbidity 2;Behavior Pattern;Past/Current Experience    Comorbidities GAD, chronic tension headaches, panic attacks, tinnitus, vertigo, cervical dysfunction    Examination-Activity Limitations Locomotion Level;Bed Mobility;Squat;Stand;Caring for Others;Transfers    Examination-Participation Restrictions Meal Prep;Cleaning;Yard Work;Laundry;Shop;Community Activity    Rehab Potential Good    PT Frequency 2x / week   decrease to 1x/wk for 4 weeks after 1st month   PT Duration 8 weeks   8 weeks total   PT Treatment/Interventions ADLs/Self Care Home Management;Vestibular;Canalith Repostioning;Therapeutic exercise;Therapeutic activities;Gait training;Balance training;Neuromuscular re-education;Dry needling;Patient/family education;Manual techniques    PT Next Visit Plan balance on foam and x1 viewing; add elliptical    PT Home Exercise Plan x1 viewing, balance on foam    Consulted and Agree with Plan of Care Patient           Patient will benefit from skilled therapeutic intervention in order to improve the following deficits and impairments:  Difficulty walking,Dizziness,Decreased balance,Pain,Decreased activity tolerance  Visit Diagnosis: Dizziness and  giddiness  Unsteadiness on feet     Problem List Patient Active Problem List   Diagnosis Date Noted  . IUD (intrauterine device) in place - May 2018, Mirena 04/23/2018  . GAD (generalized anxiety disorder) 04/23/2018    Marli Diego, Donavan Burnet, PT 12/27/2020, 7:57 PM  [facility] [address] [place], [place], [address] Phone: [phone]   Fax:  [phone]  Name: Christine Carey MRN: [id number] Date of Birth: 13-Nov-1986

## 2021-01-03 ENCOUNTER — Ambulatory Visit: Payer: BC Managed Care – PPO | Admitting: Physical Therapy

## 2021-01-03 ENCOUNTER — Other Ambulatory Visit: Payer: Self-pay

## 2021-01-03 ENCOUNTER — Ambulatory Visit: Payer: BC Managed Care – PPO | Admitting: Psychology

## 2021-01-03 DIAGNOSIS — R42 Dizziness and giddiness: Secondary | ICD-10-CM

## 2021-01-04 NOTE — Therapy (Addendum)
[facility] [address] [place], [place], [address] Phone: [phone]   Fax:  [phone]  Physical Therapy Treatment  Patient Details  Name: Christine Carey MRN: [id number] Date of Birth: [birth year] Referring Provider (PT): Metta Clines, DO   Encounter Date: 01/03/2021   PT End of Session - 01/08/21 1248    Visit Number 7    Number of Visits 8   12 requested; 2x/week x 4 weeks; 1x/wk x 4 = 12   Date for PT Re-Evaluation 02/03/21    Authorization Type BCBS    PT Start Time 0934    PT Stop Time 1015    PT Time Calculation (min) 41 min    Activity Tolerance Patient tolerated treatment well    Behavior During Therapy [facility] for tasks assessed/performed           Past Medical History:  Diagnosis Date  . Anxiety   . Chronic tension headaches   . GAD (generalized anxiety disorder)   . Mild tricuspid regurgitation   . Panic attacks   . Seasonal allergies   . Somatic dysfunction of cervical region   . Tinnitus   . Vertigo     Past Surgical History:  Procedure Laterality Date  . WISDOM TOOTH EXTRACTION      There were no vitals filed for this visit.                       Vestibular Treatment/Exercise - 01/08/21 0001      Vestibular Treatment/Exercise   Vestibular Treatment Provided Gaze    Gaze Exercises X1 Viewing Horizontal;X1 Viewing Vertical      X1 Viewing Horizontal   Foot Position bil. stance - on compliant surface (Airex)    Time --   60 secs   Reps 1    Comments patterned background - standing on Airex;  no incr. in dizziness reported with this exercise and also no LOB      X1 Viewing Vertical   Foot Position bil. stance - on compliant surface (Airex)    Time --   60 secs   Reps 1    Comments pt reported no increase in dizziness (no provocation of dizziness) reported with this exercise and also no LOB occurred          Self Care; Reviewed HEP - prioritized pt's exercises  as 1);  Habituation  2); x1 viewing  3): Balance on foam  Discussed LTG's and progress - discussed relationship of stress/anxiety/ TMJ issues/dizziness  Pt states she plans to see specialist to obtain a custom mouth splint (saw her dentist week of 12-28-20 and she offered A molded splint for $600);  Pt continues to take medication for anxiety as needed  Discussed that skilled PT is not warranted for continuation at this time as pt continues to deal with TMJ issues with tightness  In back of head and neck that extends around head to front;  Informed pt that she may want to consider ortho PT for TMJ  If this continues to be a problem  Pt requested to be placed on hold for 1 month - will continue with vestibular exercises, walking program and Yoga and will  Reassess in 1 month to determine if different and/or updated exercises are needed at that time         PT Short Term Goals - 01/03/21 0942      PT SHORT TERM GOAL #1   Title Pt will  subjectively report at least 25% improvement in dizziness.    Time 4    Period Weeks    Status Achieved    Target Date 01/06/21      PT SHORT TERM GOAL #2   Title Pt will tolerate lying on stomach for at least 15" with dizziness </= 3/10 intensity.    Time 4    Period Weeks    Status Achieved    Target Date 01/06/21      PT SHORT TERM GOAL #3   Title Pt will report ability to sleep in her bed (rather than recliner) for at least 4 hours/night.    Time 4    Period Weeks    Status Achieved    Target Date 01/06/21      PT SHORT TERM GOAL #4   Title Independent in HEP for vestibular exercises.    Time 4    Period Weeks    Status Achieved    Target Date 01/06/21             PT Long Term Goals - 01/03/21 0942      PT LONG TERM GOAL #1   Title Pt will improve DHI score from 78% to </= 30% for improved quality of life.    Baseline 78%;  score at D/C on 01-03-21 is 46% (moderate handicap group)    Time 8    Period Weeks    Status Partially  Met      PT LONG TERM GOAL #2   Title Increase FOTO score from 68/100 to >/= 78/100 to demo improvement in dizziness.    Baseline goat met as is 80% functional status score - 01-03-21    Time 8    Period Weeks    Status Achieved      PT LONG TERM GOAL #3   Title Perform DVA test and establish goal as appropriate.    Baseline 1 line difference on 12-08-20 (WNL's)    Time 8    Period Weeks    Status Achieved      PT LONG TERM GOAL #4   Title Pt will report ability to sleep in bed duration of the night, rather than in recliner for improved sleep hygiene.    Baseline met 01-03-21    Time 8    Period Weeks    Status Achieved      PT LONG TERM GOAL #5   Title Pt will report ability to lie prone without severe increase in dizziness for preferred sleeping position.    Baseline not met 01-03-21    Time 8    Period Weeks    Status Not Met      PT LONG TERM GOAL #6   Title Independent in updated HEP for balance and vestibular exercises.    Time 8    Period Weeks    Status Achieved                 Plan - 01/08/21 1237    Clinical Impression Statement Pt has met LTG's #2, 3, 4, & 6;  LTG #1 partially met as DHI score has improved from 78% to 46%, but not to goal set as </= 30%.  Pt reports she continues to have stress/anxiety which causes clenching of teeth during the night and results in TMJ problems - states she is going to see a specialist for another custom mouth splint to be fabricated to help with this problem.  Dizziness is not related to a  true vestibular dysfunction at this time - Sensory Organization Test score is WNL's and DVA is WNL's.  Pt has been instructed in habituation exercises and reports improvement in performing/tolerating positions and movements which she was initially unable to do at time of initial eval on 12-05-20.  Plan is to place pt on hold for 1 month during which time she will continue exercises for self management of dizziness prn and will pursue obtaining  custom mouth splint to assist with TMJ problems and will continue to work with her therapist for management of stress and anxiety.    Personal Factors and Comorbidities Comorbidity 2;Behavior Pattern;Past/Current Experience    Comorbidities GAD, chronic tension headaches, panic attacks, tinnitus, vertigo, cervical dysfunction    Examination-Activity Limitations Locomotion Level;Bed Mobility;Squat;Stand;Caring for Others;Transfers    Examination-Participation Restrictions Meal Prep;Cleaning;Yard Work;Laundry;Shop;Community Activity    Rehab Potential Good    PT Frequency 2x / week   decrease to 1x/wk for 4 weeks after 1st month   PT Duration 8 weeks   8 weeks total   PT Treatment/Interventions ADLs/Self Care Home Management;Vestibular;Canalith Repostioning;Therapeutic exercise;Therapeutic activities;Gait training;Balance training;Neuromuscular re-education;Dry needling;Patient/family education;Manual techniques    PT Next Visit Plan check exercises - update prn    PT Home Exercise Plan x1 viewing, balance on foam    Consulted and Agree with Plan of Care Patient           Patient will benefit from skilled therapeutic intervention in order to improve the following deficits and impairments:  Difficulty walking,Dizziness,Decreased balance,Pain,Decreased activity tolerance  Visit Diagnosis: Dizziness and giddiness     Problem List Patient Active Problem List   Diagnosis Date Noted  . IUD (intrauterine device) in place - May 2018, Mirena 04/23/2018  . GAD (generalized anxiety disorder) 04/23/2018    Anita Laguna, [place], PT 01/08/2021, 12:49 PM  [facility] [address] [place], [place], [address] Phone: [phone]   Fax:  [phone]  Name: Christine Carey MRN: [id number] Date of Birth: 10/06/1986

## 2021-01-10 ENCOUNTER — Ambulatory Visit: Payer: BC Managed Care – PPO | Admitting: Physical Therapy

## 2021-01-10 ENCOUNTER — Ambulatory Visit: Payer: BC Managed Care – PPO | Admitting: Psychology

## 2021-01-13 ENCOUNTER — Telehealth: Payer: Self-pay | Admitting: Neurology

## 2021-01-13 ENCOUNTER — Other Ambulatory Visit: Payer: Self-pay

## 2021-01-13 MED ORDER — NORTRIPTYLINE HCL 25 MG PO CAPS: ORAL_CAPSULE | ORAL | 4 refills | Status: DC

## 2021-01-13 NOTE — Telephone Encounter (Signed)
Patient called requesting a new prescription for nortriptyline 25 mg. She said she was told to take 10 mg then increase to 25 mg. Patient stated she's been taking 20 mg and is almost out. She only has enough for today.  [facility] [address]

## 2021-01-17 ENCOUNTER — Ambulatory Visit: Payer: BC Managed Care – PPO | Admitting: Psychology

## 2021-01-17 ENCOUNTER — Encounter: Payer: BC Managed Care – PPO | Admitting: Physical Therapy

## 2021-01-24 ENCOUNTER — Ambulatory Visit: Payer: BC Managed Care – PPO | Admitting: Psychology

## 2021-01-31 ENCOUNTER — Ambulatory Visit: Payer: BC Managed Care – PPO | Admitting: Psychology

## 2021-02-07 ENCOUNTER — Ambulatory Visit: Payer: BC Managed Care – PPO | Admitting: Psychology

## 2021-02-09 ENCOUNTER — Ambulatory Visit: Payer: BC Managed Care – PPO | Admitting: Physical Therapy

## 2021-02-14 ENCOUNTER — Ambulatory Visit: Payer: BC Managed Care – PPO | Admitting: Psychology

## 2021-03-03 ENCOUNTER — Other Ambulatory Visit: Payer: Self-pay

## 2021-03-03 MED ORDER — NORTRIPTYLINE HCL 10 MG PO CAPS
ORAL_CAPSULE | ORAL | 1 refills | Status: DC
Start: 2021-03-03 — End: 2021-04-25

## 2021-04-24 NOTE — Progress Notes (Signed)
NEUROLOGY FOLLOW UP OFFICE NOTE  Canisha Issac [id number]  Assessment/Plan:   1.  Tension-type headache 2.  Vertigo 3.  Cervicalgia 4.  Generalized anxiety disorder  1.  She will try discontinuing nortriptyline.  If headaches return, we can always restart it. 2.  She will try to work on management for anxiety 3.  Continue neck exercises 4.  Follow up 6 month.s  Subjective:  Ader Fritze is a 35 year old right-handed female who follows up for dizziness, headache and cervicalgia  UPDATE: Current medications: nortriptyline 10mg  QHS, alprazolam 0.5mg  daily PRN, promethazine 25mg , Mirena  MRI of cervical spine on 11/19/2020 personally reviewed showed mild noncompressive disc bulging at C3-4 through C6-7 without significant stenosis or neural impingement.   Referred for vestibular rehab.  She feels better. Taking 0.5 alprazolam at bedtime helps when needed. Last month she developed eustachian tube dysfunction and treated with decongestants and nose sprays.   Started nortriptyline.  Reported side effects on 25mg , so dose was reduced back to 10mg  QHS.  Also prescribed tizanidine for muscle spasms in neck.   Referred to the dentist for evaluation of TMJ dysfunction.  She now is using a mouth guard.   Headaches are now infrequent, no more than once a week.  Usually treats with heating pad but may take ibuprofen She thinks her anxiety had aggravated her symptoms.  HISTORY: In late March 2021, she received her first 11/21/2020 vaccine.  In early April, she developed vertigo/spinning episode.  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  Then next day, she felt off and developed brief spinning when bending over or other change in position.  She started experiencing sinus pressure.  She was evaluated by ENT and allergist.  She was found to have allergies to pollen.  She was noted to have inflammation in her nose and treated with antihistamines and  steroids, which were ineffective.  She was started on cefdinir for sinus infection. When she finished, she began experiencing severe nausea and dry heaves.  She returned to the ED in May.  CT head, personally reviewed, showed normal sinuses and no acute intracranial abnormality and was started on augmentin for presumed sinusitis.  She began experiencing aural fullness and tinnitus.  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  She noted minimal improvement.  She reported some neck pain and cracking in her neck with movement.  She went to physical therapy and later a chiropractor.  Cervical spine X-ray reportedly showed that her C1-2 was "off".  She underwent 8 weeks of therapy including adjustments and dry needling which did not produce significant improvement.  She underwent VNG testing in July in which findings significant for cervical vertigo, particularly in head right position but due to intolerance, the caloric testing was not completed and could not rule out left unilateral vestibular hypofunction.  Continued PT/OMT for cervical vertigo, VOR strengthening exercises was recommended.  She still has a fairly persistent dizziness, not a spinning but a feeling of being off-balance.  In the mornings, she has dull pressure in a band-like distribution around her head and back of her neck.  There may be some nausea and photophobia.  It tends to subside later in the day.  She does report increased anxiety.  Past medications:  Tizanidine (she felt it was too strong), Lexapro (side effects), Zoloft (side effects)  PAST MEDICAL HISTORY: Past Medical History:  Diagnosis Date  .  Anxiety   . Chronic tension headaches   . GAD (generalized anxiety disorder)   . Mild tricuspid regurgitation   . Panic attacks   . Seasonal allergies   . Somatic dysfunction of cervical region   . Tinnitus   . Vertigo     MEDICATIONS: Current Outpatient Medications on File Prior to  Visit  Medication Sig Dispense Refill  . ALPRAZolam (XANAX) 0.5 MG tablet Take 1 tablet (0.5 mg total) by mouth daily as needed for anxiety. 30 tablet 0  . levonorgestrel (MIRENA) 20 MCG/24HR IUD 1 each by Intrauterine route once.    . nortriptyline (PAMELOR) 10 MG capsule Take 1 tablet at bedtime 30 capsule 1  . promethazine (PHENERGAN) 25 MG tablet Take 1 tablet (25 mg total) by mouth every 6 (six) hours as needed for nausea or vomiting. 20 tablet 0  . tiZANidine (ZANAFLEX) 4 MG tablet Take 1 tablet (4 mg total) by mouth at bedtime. (Patient taking differently: Take 4 mg by mouth daily as needed.) 30 tablet 5   No current facility-administered medications on file prior to visit.    ALLERGIES: No Known Allergies  FAMILY HISTORY: Family History  Problem Relation Age of Onset  . Hypertension Mother   . Hyperlipidemia Mother   . Hypertension Father   . Hyperlipidemia Father   . Birth defects Daughter        1 kidney  . Lung cancer Maternal Grandmother   . Stroke Paternal Grandmother   . Stroke Paternal Grandfather   . Healthy Daughter   . Healthy Brother   . Colon cancer Neg Hx   . Stomach cancer Neg Hx   . Rectal cancer Neg Hx   . Pancreatic cancer Neg Hx       Objective:  Blood pressure 116/64, pulse 94, height 4\' 11"  (1.499 m), weight 137 lb 6.4 oz (62.3 kg), SpO2 100 %, unknown if currently breastfeeding. General: No acute distress.  Patient appears well-groomed.   Head:  Normocephalic/atraumatic Eyes:  Fundi examined but not visualized Neck: supple, no paraspinal tenderness, full range of motion Heart:  Regular rate and rhythm Lungs:  Clear to auscultation bilaterally Back: No paraspinal tenderness Neurological Exam: alert and oriented to person, place, and time. Speech fluent and not dysarthric, language intact.  CN II-XII intact. Bulk and tone normal, muscle strength 5/5 throughout.  Sensation to light touch  intact.  Deep tendon reflexes 2+ throughout.  Finger to  nose testing intact.  Gait normal, Romberg negative.     , DO  CC: Shon Millet, MD

## 2021-04-25 ENCOUNTER — Encounter: Payer: Self-pay | Admitting: Neurology

## 2021-04-25 ENCOUNTER — Other Ambulatory Visit: Payer: Self-pay

## 2021-04-25 ENCOUNTER — Ambulatory Visit (INDEPENDENT_AMBULATORY_CARE_PROVIDER_SITE_OTHER): Payer: BC Managed Care – PPO | Admitting: Neurology

## 2021-04-25 VITALS — BP 116/64 | HR 94 | Ht 59.0 in | Wt 137.4 lb

## 2021-04-25 DIAGNOSIS — G44219 Episodic tension-type headache, not intractable: Secondary | ICD-10-CM | POA: Diagnosis not present

## 2021-04-25 DIAGNOSIS — R42 Dizziness and giddiness: Secondary | ICD-10-CM

## 2021-04-25 DIAGNOSIS — M542 Cervicalgia: Secondary | ICD-10-CM | POA: Diagnosis not present

## 2021-04-25 DIAGNOSIS — F411 Generalized anxiety disorder: Secondary | ICD-10-CM | POA: Diagnosis not present

## 2021-04-25 NOTE — Patient Instructions (Signed)
1.  Try stopping nortriptyline 2.  May use tizanidine as needed for neck pain - caution for drowsiness 3.  Limit use of pain relievers to no more than 2 days out of week to prevent risk of rebound or medication-overuse headache. 4.  Follow up 6 months.

## 2021-06-09 ENCOUNTER — Other Ambulatory Visit: Payer: Self-pay | Admitting: Neurology

## 2021-06-09 MED ORDER — SERTRALINE HCL 25 MG PO TABS: 25.0000 mg | ORAL_TABLET | Freq: Every day | ORAL | 5 refills | Status: DC

## 2021-06-09 NOTE — Telephone Encounter (Signed)
Patient called to check and see if this will be done for her today. [facility] [address] is fine.

## 2021-06-15 NOTE — Telephone Encounter (Signed)
Let pt know that Dr. Everlena Cooper will address when he returns next week since this involves complete management change

## 2021-06-20 ENCOUNTER — Other Ambulatory Visit: Payer: Self-pay

## 2021-06-20 MED ORDER — NORTRIPTYLINE HCL 10 MG PO CAPS: 10.0000 mg | ORAL_CAPSULE | Freq: Every day | ORAL | 1 refills | Status: DC

## 2021-10-27 NOTE — Progress Notes (Signed)
NEUROLOGY FOLLOW UP OFFICE NOTE  Dandra Shambaugh [id number]  Assessment/Plan:   Tension-type headache, not intractable Cervicalgia Generalized anxiety disorder Vertigo - overall resolved - unclear if cervicogenic vs postural-perceptive dizziness  To treat anxiety, headache and myofascial neck pain, will start duloxetine 30mg  daily.   Recommended making an appointment with psychiatry for alternative or additional pharmacologic management.  Continue therapy with therapist.  May want to ask about cognitive behavioral therapy Follow up 6 months.   Subjective:  Emrey Thornley is a 35 year old right-handed female who follows up for dizziness, headache and cervicalgia   UPDATE: Current medications: alprazolam 0.5mg  daily PRN, promethazine 25mg , Mirena   Due to increased headaches, started sertraline in June but it caused nausea and caused her to clench down with her jaw.  She was restarted on nortriptyline but decided to stop.  She has since been diagnosed with anxiety with OCD.  She really hasn't been experiencing dizziness.  It is mostly the dull head pressure and neck tightness.  When she bends over, she particularly feels tightness and spasms in the neck.      HISTORY: In late March 2021, she received her first [place] vaccine.  In early April 2021, she developed vertigo/spinning episode.  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  Then next day, she felt off and developed brief spinning when bending over or other change in position.  She started experiencing sinus pressure.  She was evaluated by ENT and allergist.  She was found to have allergies to pollen.  She was noted to have inflammation in her nose and treated with antihistamines and steroids, which were ineffective.  She was started on cefdinir for sinus infection. When she finished, she began experiencing severe nausea and dry heaves.  She returned to the ED in May.  CT head showed normal  sinuses and no acute intracranial abnormality and was started on augmentin for presumed sinusitis.  She began experiencing aural fullness and tinnitus.  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  She reported some neck pain and cracking in her neck with movement.  She went to physical therapy and later a chiropractor.  Cervical spine X-ray reportedly showed that her C1-2 was "off".  MRI of cervical spine on 11/19/2020 showed mild noncompressive disc bulging at C3-4 through C6-7 without significant stenosis or neural impingement.    She underwent 8 weeks of therapy including adjustments and dry needling which did not produce significant improvement.  She underwent VNG testing in July in which findings significant for cervical vertigo, particularly in head right position but due to intolerance, the caloric testing was not completed and could not rule out left unilateral vestibular hypofunction.  Continued PT/OMT for cervical vertigo, VOR strengthening exercises was recommended.     Past medications:  Tizanidine (she felt it was too strong), Lexapro (side effects), Zoloft (side effects), sertraline (side effects), nortriptyline (25mg  caused side effects)  PAST MEDICAL HISTORY: Past Medical History:  Diagnosis Date   Anxiety    Chronic tension headaches    GAD (generalized anxiety disorder)    Mild tricuspid regurgitation    Panic attacks    Seasonal allergies    Somatic dysfunction of cervical region    Tinnitus    Vertigo     MEDICATIONS: Current Outpatient Medications on File Prior to Visit  Medication Sig Dispense Refill   ALPRAZolam (XANAX) 0.5 MG tablet Take 1  tablet (0.5 mg total) by mouth daily as needed for anxiety. 30 tablet 0   levonorgestrel (MIRENA) 20 MCG/24HR IUD 1 each by Intrauterine route once.     nortriptyline (PAMELOR) 10 MG capsule Take 1 capsule (10 mg total) by mouth at bedtime. 90 capsule 1   promethazine (PHENERGAN) 25 MG  tablet Take 1 tablet (25 mg total) by mouth every 6 (six) hours as needed for nausea or vomiting. 20 tablet 0   tiZANidine (ZANAFLEX) 4 MG tablet Take 1 tablet (4 mg total) by mouth at bedtime. (Patient not taking: Reported on 04/25/2021) 30 tablet 5   No current facility-administered medications on file prior to visit.    ALLERGIES: No Known Allergies  FAMILY HISTORY: Family History  Problem Relation Age of Onset   Hypertension Mother    Hyperlipidemia Mother    Hypertension Father    Hyperlipidemia Father    Birth defects Daughter        1 kidney   Lung cancer Maternal Grandmother    Stroke Paternal Grandmother    Stroke Paternal Grandfather    Healthy Daughter    Healthy Brother    Colon cancer Neg Hx    Stomach cancer Neg Hx    Rectal cancer Neg Hx    Pancreatic cancer Neg Hx       Objective:  Blood pressure 110/75, pulse 83, height 4\' 11"  (1.499 m), weight 139 lb 9.6 oz (63.3 kg), SpO2 100 %, unknown if currently breastfeeding. General: No acute distress.  Patient appears well-groomed.    , DO  CC: Shon Millet, MD

## 2021-10-30 ENCOUNTER — Other Ambulatory Visit: Payer: Self-pay

## 2021-10-30 ENCOUNTER — Ambulatory Visit: Payer: 59 | Admitting: Neurology

## 2021-10-30 ENCOUNTER — Encounter: Payer: Self-pay | Admitting: Neurology

## 2021-10-30 VITALS — BP 110/75 | HR 83 | Ht 59.0 in | Wt 139.6 lb

## 2021-10-30 DIAGNOSIS — M542 Cervicalgia: Secondary | ICD-10-CM | POA: Diagnosis not present

## 2021-10-30 DIAGNOSIS — F411 Generalized anxiety disorder: Secondary | ICD-10-CM | POA: Diagnosis not present

## 2021-10-30 DIAGNOSIS — G44219 Episodic tension-type headache, not intractable: Secondary | ICD-10-CM | POA: Diagnosis not present

## 2021-10-30 MED ORDER — DULOXETINE HCL 30 MG PO CPEP: 30.0000 mg | ORAL_CAPSULE | Freq: Every day | ORAL | 3 refills | Status: DC

## 2021-10-30 NOTE — Patient Instructions (Signed)
If agreeable, will start duloxetine 30mg  daily.  If you decide to start, let me know.  If no improvement in headache/neck pain in 6 weeks, let me know and we can increase dose Continue therapy - ask about cognitive behavioral therapy. Recommend seeing psychiatry Follow up 6 months.

## 2022-01-02 ENCOUNTER — Encounter: Payer: Self-pay | Admitting: Neurology

## 2022-04-23 ENCOUNTER — Other Ambulatory Visit: Payer: Self-pay | Admitting: Family Medicine

## 2022-04-23 DIAGNOSIS — G894 Chronic pain syndrome: Secondary | ICD-10-CM

## 2022-04-23 DIAGNOSIS — R531 Weakness: Secondary | ICD-10-CM

## 2022-04-23 DIAGNOSIS — G709 Myoneural disorder, unspecified: Secondary | ICD-10-CM

## 2022-05-12 ENCOUNTER — Ambulatory Visit
Admission: RE | Admit: 2022-05-12 | Discharge: 2022-05-12 | Disposition: A | Discharge status: Home / Self Care | Payer: 59 | Source: Ambulatory Visit | Attending: Family Medicine | Admitting: Family Medicine

## 2022-05-12 DIAGNOSIS — R531 Weakness: Secondary | ICD-10-CM

## 2022-05-12 DIAGNOSIS — G894 Chronic pain syndrome: Secondary | ICD-10-CM

## 2022-05-12 DIAGNOSIS — G709 Myoneural disorder, unspecified: Secondary | ICD-10-CM

## 2022-05-12 IMAGING — MR MR HEAD WO/W CM
13 series · 48 of 48 positions shown · IV contrast (multihance)
Comparison: Cervical spine MRI [DATE].  Head CT [DATE].

CLINICAL DATA: 36-year-old female with bilateral muscle pain and
weakness for 6 months, mainly and legs. Sporadic tingling in the
extremities.

EXAM:
MRI HEAD WITHOUT AND WITH CONTRAST
TECHNIQUE: Multiplanar, multiecho pulse sequences of the brain and surrounding
structures were obtained without and with intravenous contrast.
CONTRAST:  12mL MULTIHANCE GADOBENATE DIMEGLUMINE 529 MG/ML IV SOLN

[Series 2: T1 · sagittal · 5.0mm · 0.47mm/px · 2 of 24 slices shown]
[im 1/24]
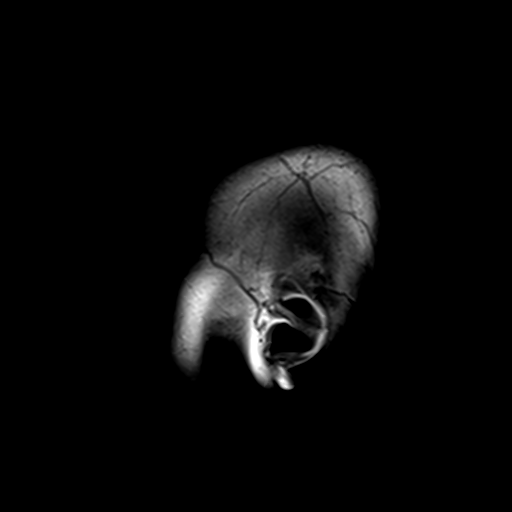
[im 24/24]
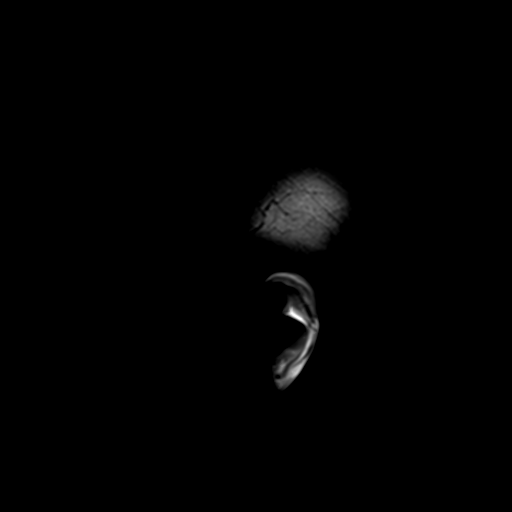

[Series 3: DWI · axial · 3.0mm · 1.80mm/px · z∈[-70,+80]mm · 7 of 104 slices shown (1 of 4)]
[im 1/104]
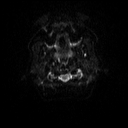
[im 18/104]
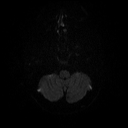
[im 35/104]
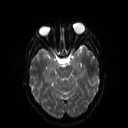
[im 52/104]
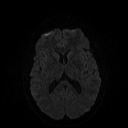
[im 69/104]
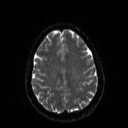
[im 86/104]
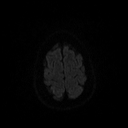
[im 104/104]
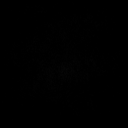

[Series 4: DWI · axial · 3.0mm · 1.80mm/px · z∈[-70,+80]mm · 3 of 51 slices shown (2 of 4)]
[im 1/51]
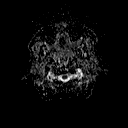
[im 26/51]
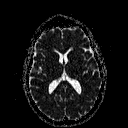
[im 51/51]
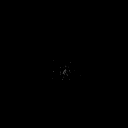

[Series 5: DWI · coronal · 5.0mm · 1.80mm/px · 4 of 69 slices shown (3 of 4)]
[im 1/69]
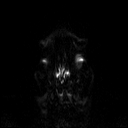
[im 23/69]
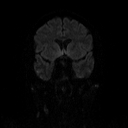
[im 46/69]
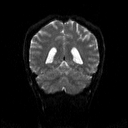
[im 69/69]
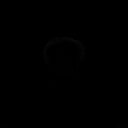

[Series 6: DWI · coronal · 5.0mm · 1.80mm/px · 2 of 36 slices shown (4 of 4)]
[im 1/36]
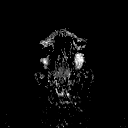
[im 36/36]
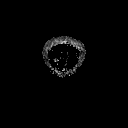

[Series 7: T2 · axial · 5.0mm · 0.72mm/px · 1 of 22 slices shown (1 of 2)]
[im 1/22]
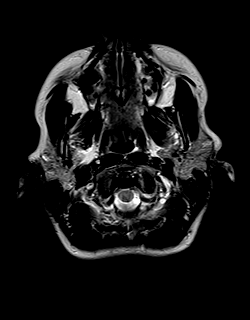

[Series 8: FLAIR · axial · 3.0mm · 0.45mm/px · z∈[-67,+74]mm · 2 of 32 slices shown]
[im 1/32]
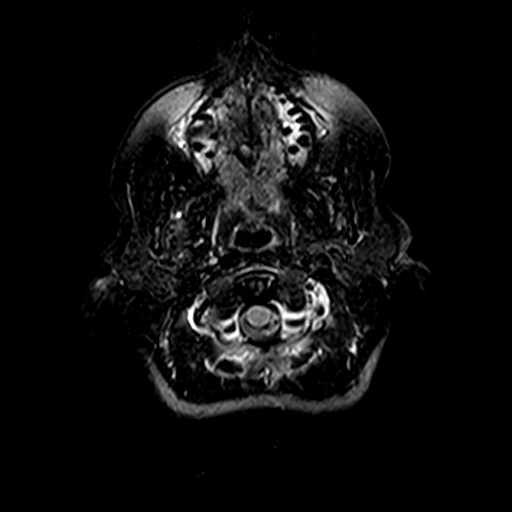
[im 32/32]
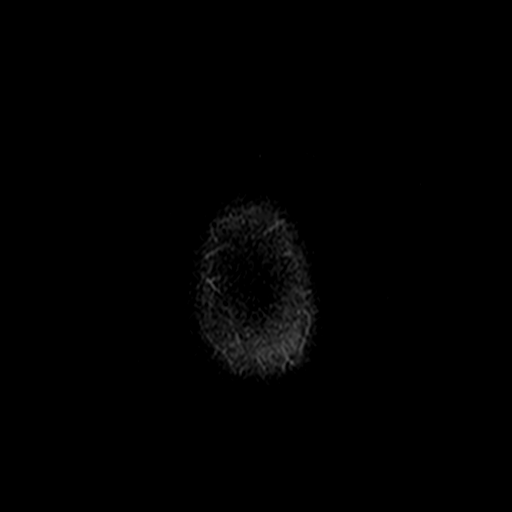

[Series 9: mip_images(sw) · axial · 32.0mm · 0.90mm/px · z∈[-57,+68]mm · 2 of 33 slices shown]
[im 1/33]
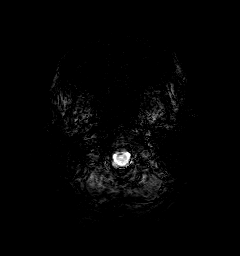
[im 33/33]
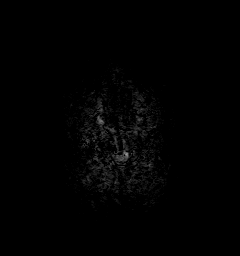

[Series 10: swi_images · axial · 4.0mm · 0.90mm/px · z∈[-71,+82]mm · 3 of 40 slices shown]
[im 1/40]
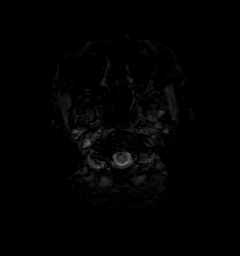
[im 20/40]
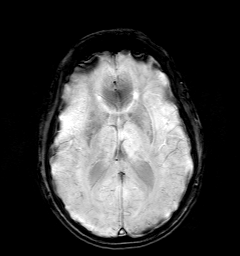
[im 40/40]
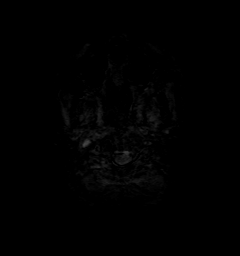

[Series 11: t1_mpr_tra · axial · 1.0mm · 0.75mm/px · z∈[-68,+72]mm · 9 of 144 slices shown]
[im 1/144]
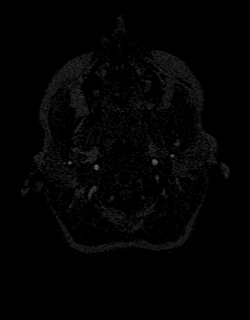
[im 18/144]
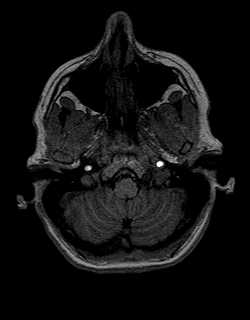
[im 36/144]
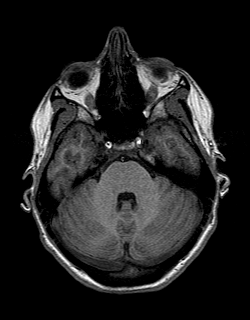
[im 54/144]
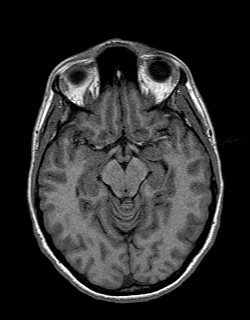
[im 72/144]
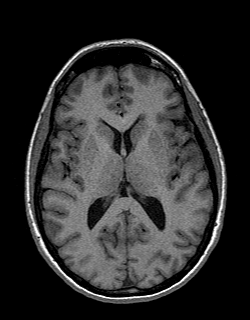
[im 90/144]
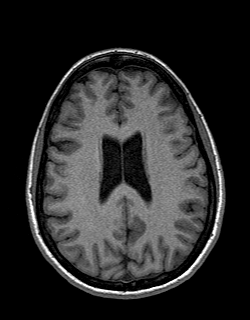
[im 108/144]
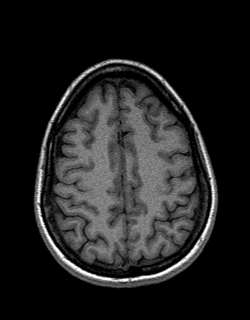
[im 126/144]
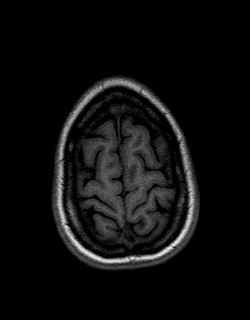
[im 144/144]
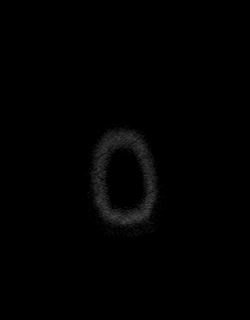

[Series 12: T2 · coronal · 5.0mm · 0.45mm/px · 2 of 27 slices shown (2 of 2)]
[im 1/27]
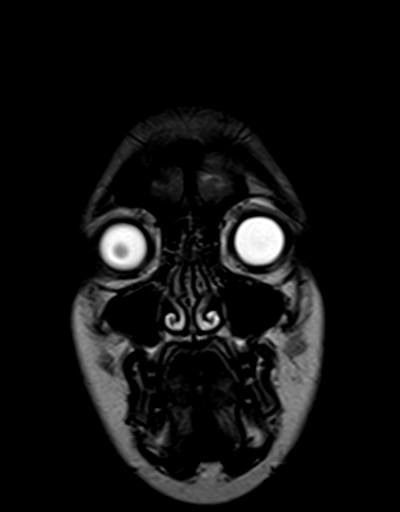
[im 27/27]
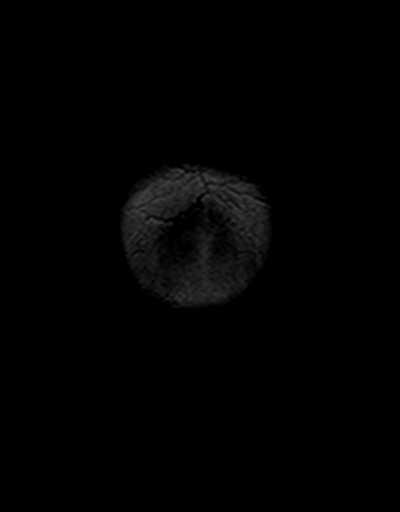

[Series 13: t1_mpr_tra post · axial · 1.0mm · 0.75mm/px · z∈[-68,+72]mm · 9 of 144 slices shown]
[im 1/144]
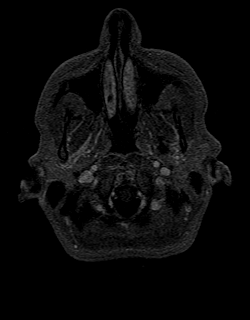
[im 18/144]
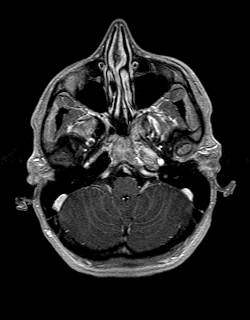
[im 36/144]
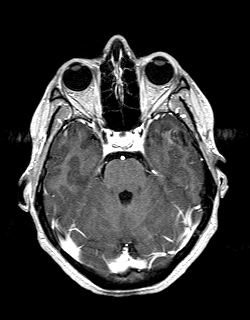
[im 54/144]
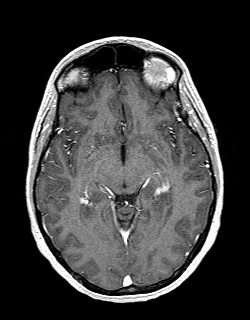
[im 72/144]
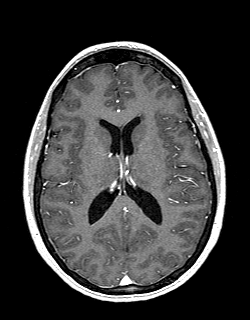
[im 90/144]
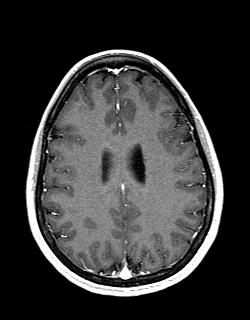
[im 108/144]
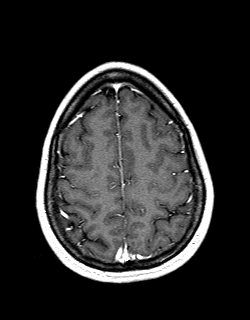
[im 126/144]
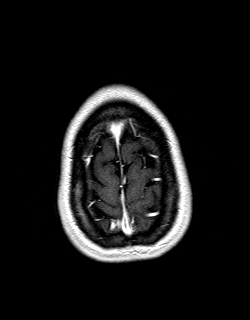
[im 144/144]
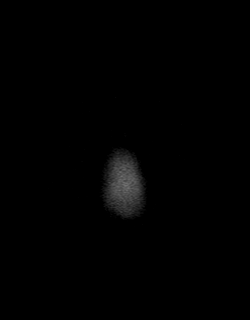

[Series 14: post cor · coronal · 5.0mm · 0.45mm/px · 2 of 27 slices shown]
[im 1/27]
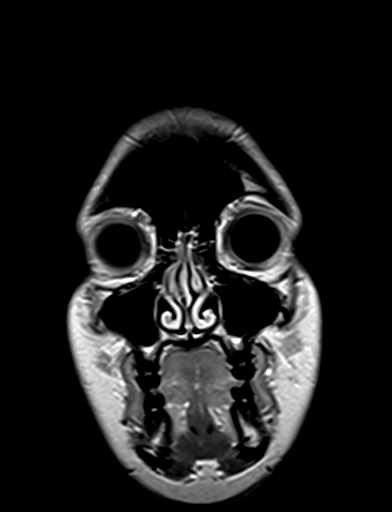
[im 27/27]
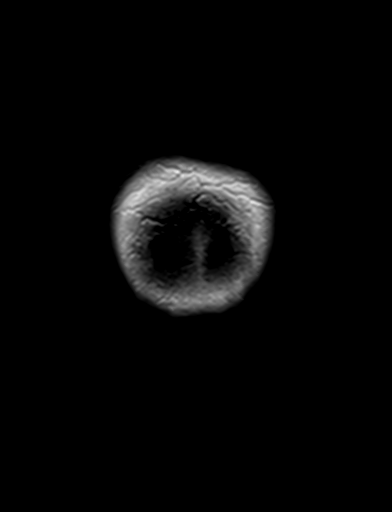

[48 of 48 positions shown; findings below may reference images not displayed]

FINDINGS: Brain: Cerebral volume is within normal limits. No restricted
diffusion to suggest acute infarction. No midline shift, mass
effect, evidence of mass lesion, ventriculomegaly, extra-axial
collection or acute intracranial hemorrhage. Cervicomedullary
junction and pituitary are within normal limits.

Gray and white matter signal is within normal limits throughout the
brain. No encephalomalacia or chronic cerebral blood products
identified. No abnormal enhancement identified. No dural thickening.

Vascular: Major intracranial vascular flow voids are preserved. The
major dural venous sinuses are enhancing and appear to be patent.

Skull and upper cervical spine: Negative visible cervical spine.
Visualized bone marrow signal is within normal limits.

Sinuses/Orbits: Negative orbits.  Paranasal sinuses are clear.

Other: Mastoid air cells are clear. Visible internal auditory
structures appear normal. Negative visible scalp and face.
IMPRESSION: Normal MRI appearance of the brain.

## 2022-05-12 MED ORDER — GADOBENATE DIMEGLUMINE 529 MG/ML IV SOLN
12.0000 mL | Freq: Once | INTRAVENOUS | Status: AC | PRN
  Administered 2022-05-12: 12 mL via INTRAVENOUS

## 2022-05-22 ENCOUNTER — Ambulatory Visit
Admission: RE | Admit: 2022-05-22 | Discharge: 2022-05-22 | Disposition: A | Discharge status: Home / Self Care | Payer: 59 | Source: Ambulatory Visit | Attending: Family Medicine | Admitting: Family Medicine

## 2022-05-22 DIAGNOSIS — G709 Myoneural disorder, unspecified: Secondary | ICD-10-CM

## 2022-05-22 DIAGNOSIS — G894 Chronic pain syndrome: Secondary | ICD-10-CM

## 2022-05-22 DIAGNOSIS — R531 Weakness: Secondary | ICD-10-CM

## 2022-05-22 IMAGING — MR MR LUMBAR SPINE WO/W CM
5 of 7 series · 26 of 48 positions shown · IV contrast (multihance)
Comparison: None Available.

CLINICAL DATA: Initial evaluation for lower back pain with
bilateral buttock and hip pain.

EXAM:
MRI LUMBAR SPINE WITHOUT AND WITH CONTRAST
TECHNIQUE: Multiplanar and multiecho pulse sequences of the lumbar spine were
obtained without and with intravenous contrast.
CONTRAST:  10mL MULTIHANCE GADOBENATE DIMEGLUMINE 529 MG/ML IV SOLN

[Series 3: T2 · sagittal · 4.0mm · 0.53mm/px · 3 of 13 slices shown (1 of 2)]
[im 1/13]
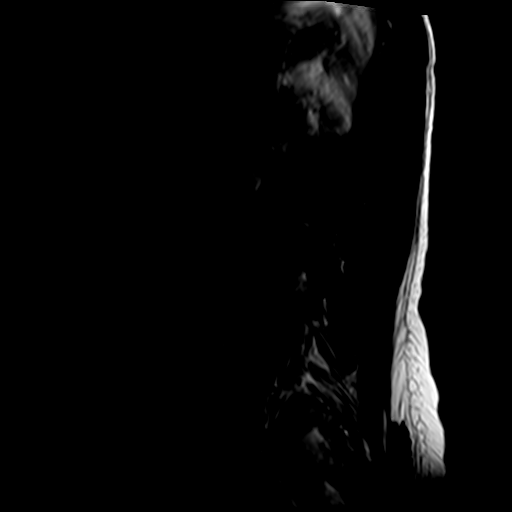
[im 7/13]
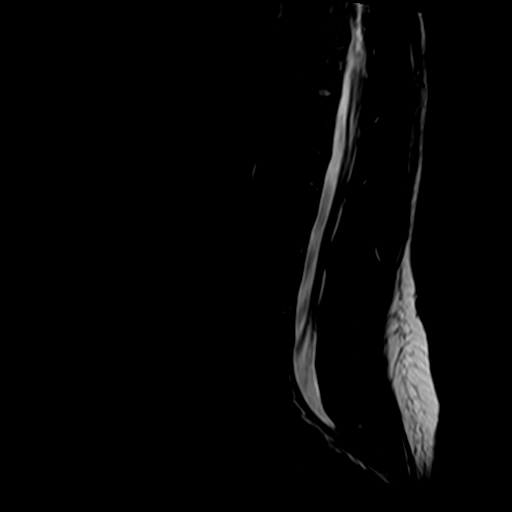
[im 13/13]
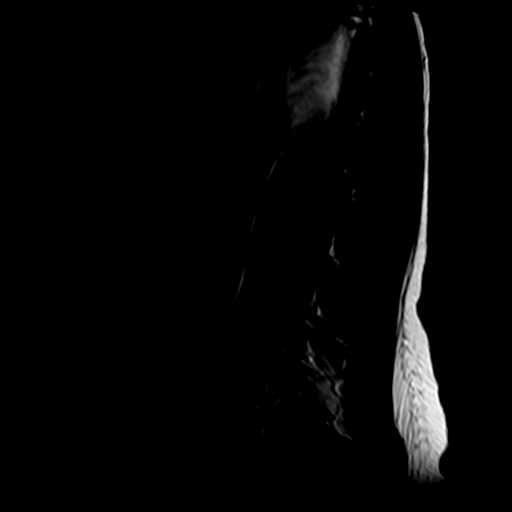

[Series 5: T1 · sagittal · 4.0mm · 0.53mm/px · 5 of 15 slices shown (1 of 2)]
[im 1/15]
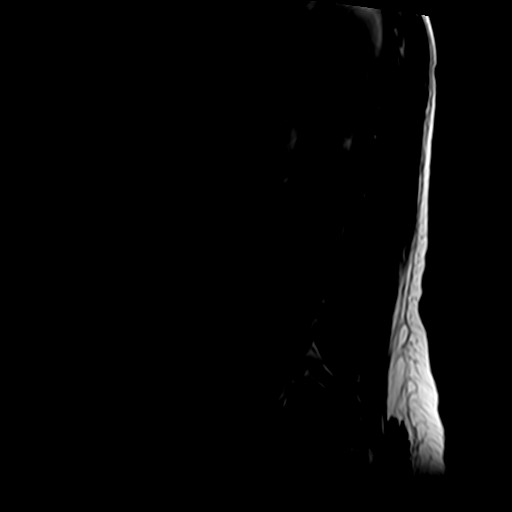
[im 4/15]
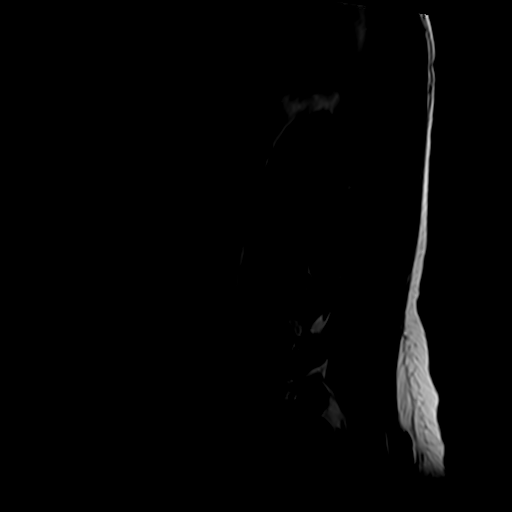
[im 8/15]
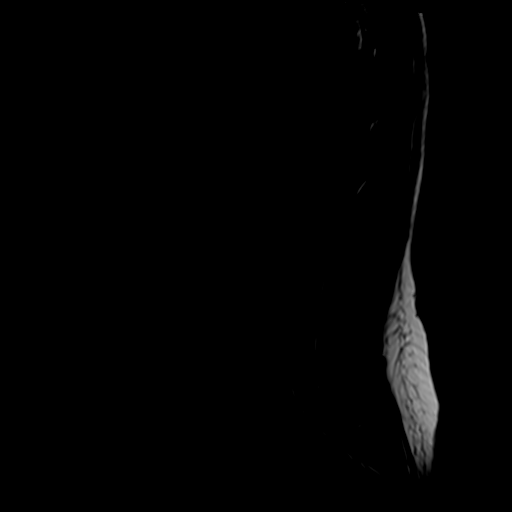
[im 11/15]
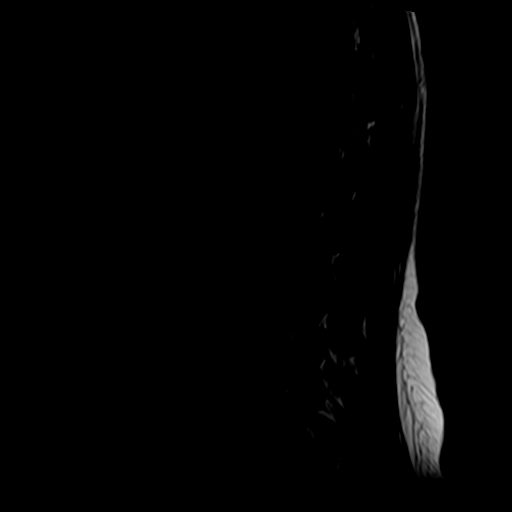
[im 15/15]
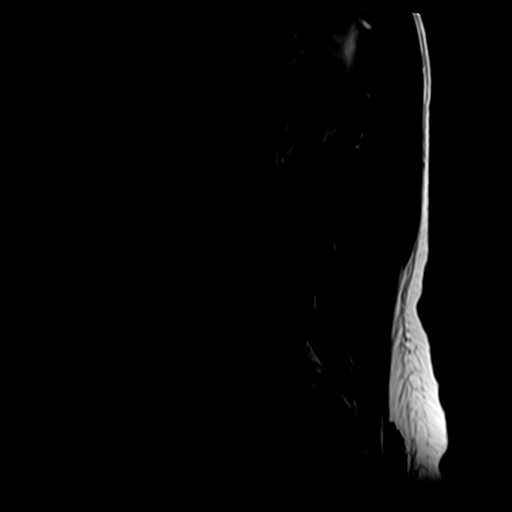

[Series 6: T2 · axial · 4.0mm · 0.70mm/px · z∈[-171,+4]mm · 8 of 32 slices shown (2 of 2)]
[im 1/32]
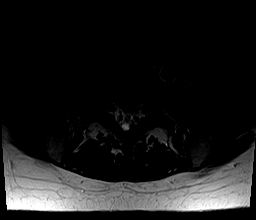
[im 4/32]
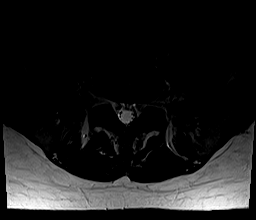
[im 11/32]
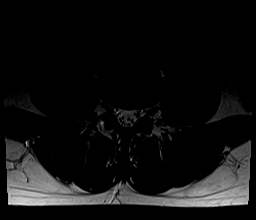
[im 14/32]
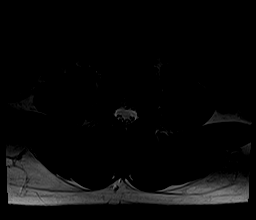
[im 18/32]
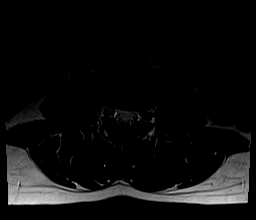
[im 21/32]
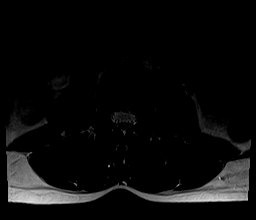
[im 28/32]
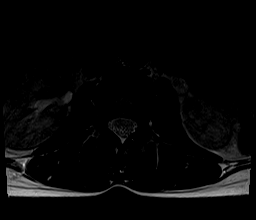
[im 32/32]
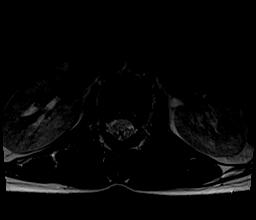

[Series 7: T1 · axial · 4.0mm · 0.35mm/px · z∈[-171,+4]mm · 8 of 32 slices shown (2 of 2)]
[im 1/32]
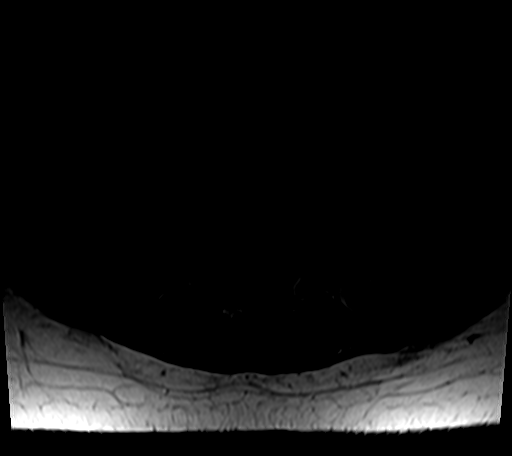
[im 4/32]
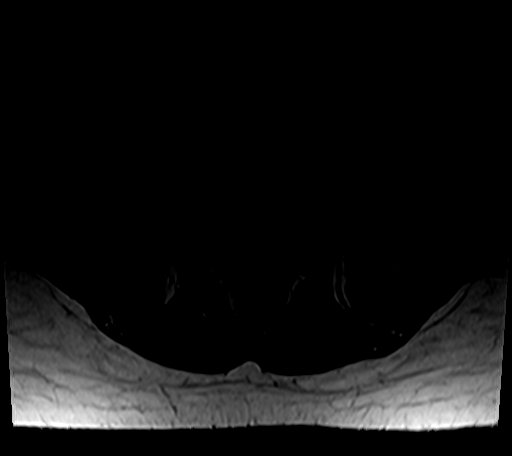
[im 11/32]
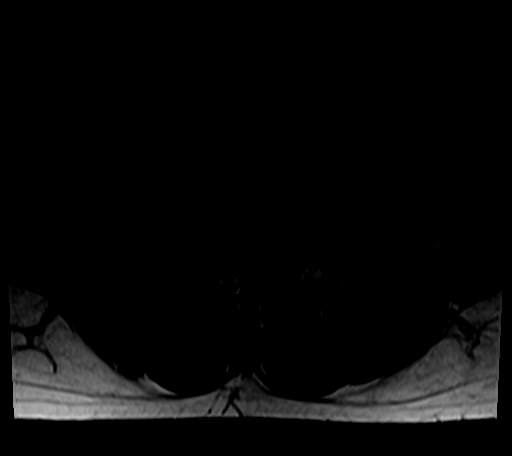
[im 14/32]
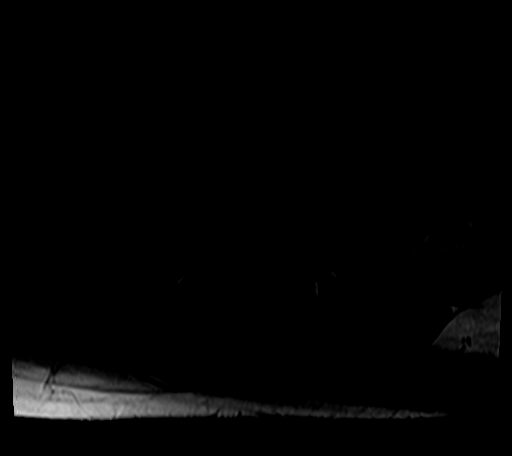
[im 18/32]
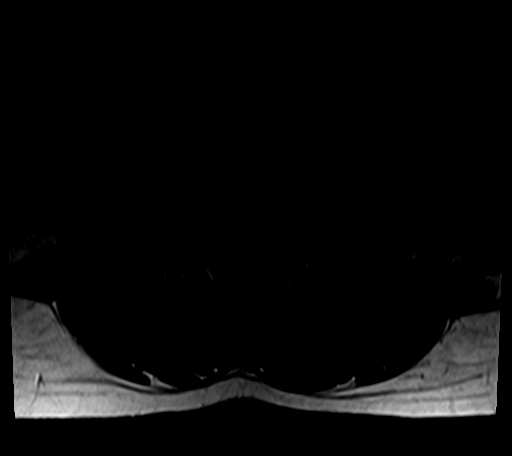
[im 21/32]
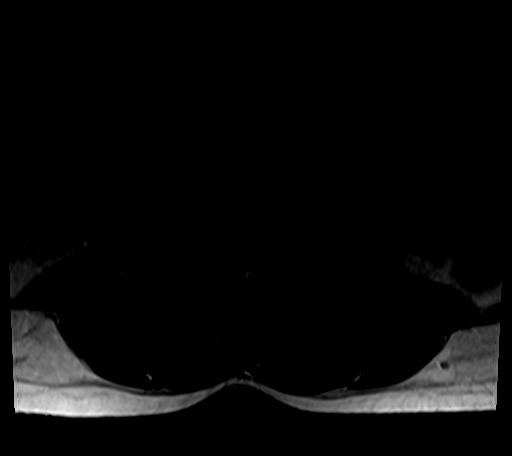
[im 28/32]
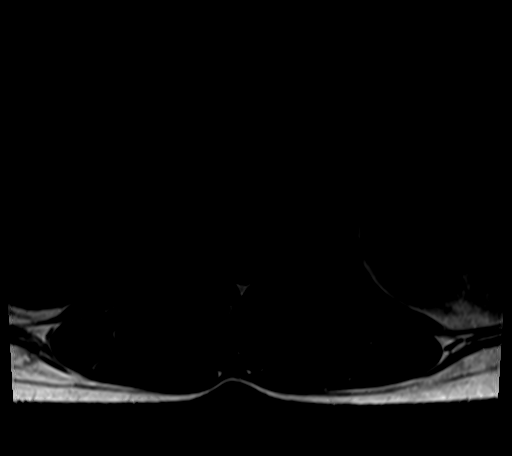
[im 32/32]
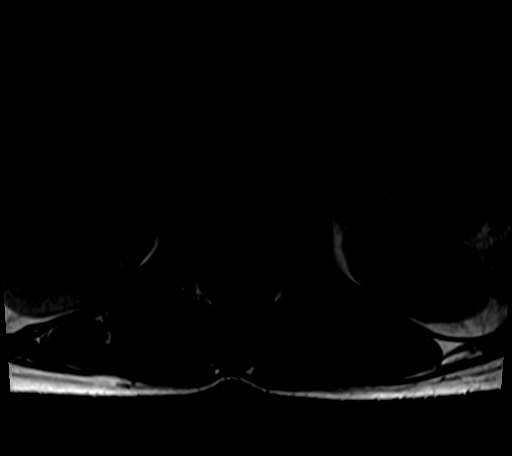

[Series 9: T1 fat-sat post-contrast · sagittal · 4.0mm · 0.53mm/px · 2 of 15 slices shown]
[im 1/15]
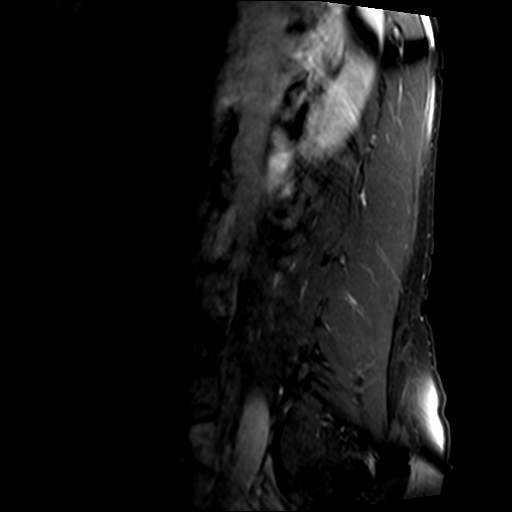
[im 4/15]
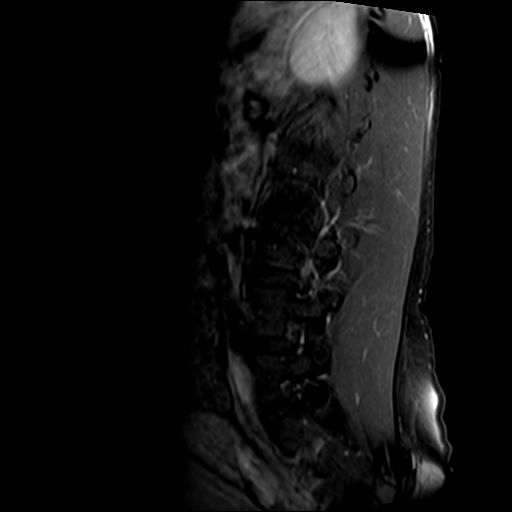

[26 of 48 positions shown; findings below may reference images not displayed]

FINDINGS: Segmentation: Standard. Lowest well-formed disc space labeled the
L5-S1 level.

Alignment: Physiologic with preservation of the normal lumbar
lordosis. No listhesis.

Vertebrae: Vertebral body height maintained without acute or chronic
fracture. Bone marrow signal intensity within normal limits. No
discrete or worrisome osseous lesions. No abnormal marrow edema or
enhancement.

Conus medullaris and cauda equina: Conus extends to the L1 level.
Conus and cauda equina appear normal.

Paraspinal and other soft tissues: Unremarkable.

Disc levels:

No significant disc pathology seen within the lumbar spine.
Intervertebral discs are well hydrated with preserved disc height.
No disc bulge or focal disc herniation. No significant facet
pathology. No significant canal or neural foraminal stenosis. No
evidence for neural impingement.
IMPRESSION: Normal MRI of the lumbar spine. No findings to explain patient's
symptoms identified.

## 2022-05-22 MED ORDER — GADOBENATE DIMEGLUMINE 529 MG/ML IV SOLN
10.0000 mL | Freq: Once | INTRAVENOUS | Status: AC | PRN
  Administered 2022-05-22: 10 mL via INTRAVENOUS

## 2022-06-01 NOTE — Progress Notes (Unsigned)
NEUROLOGY FOLLOW UP OFFICE NOTE  Christine Carey [id number]  Assessment/Plan:   Tension-type headache, not intractable Cervicalgia Generalized anxiety disorder Vertigo - overall resolved - unclear if cervicogenic vs postural-perceptive dizziness   To treat anxiety, headache and myofascial neck pain, will start duloxetine 30mg  daily.   Recommended making an appointment with psychiatry for alternative or additional pharmacologic management.  Continue therapy with therapist.  May want to ask about cognitive behavioral therapy Follow up 6 months.     Subjective:  Christine Carey is a 36 year old right-handed female who follows up for dizziness, headache and cervicalgia   UPDATE: Current medications: alprazolam 0.5mg  daily PRN, promethazine 25mg , Mirena   Last visit, started duloxetine which she discontinued due to causing diarrhea and hot flashes.   HISTORY: In late March 2021, she received her first vaccine.  In early April 2021, she developed vertigo/spinning episode.  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  Then next day, she felt off and developed brief spinning when bending over or other change in position.  She started experiencing sinus pressure.  She was evaluated by ENT and allergist.  She was found to have allergies to pollen.  She was noted to have inflammation in her nose and treated with antihistamines and steroids, which were ineffective.  She was started on cefdinir for sinus infection. When she finished, she began experiencing severe nausea and dry heaves.  She returned to the ED in May.  CT head showed normal sinuses and no acute intracranial abnormality and was started on augmentin for presumed sinusitis.  She began experiencing aural fullness and tinnitus.  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  She reported some neck pain and cracking in her neck with movement.  She  went to physical therapy and later a chiropractor.  Cervical spine X-ray reportedly showed that her C1-2 was "off".  MRI of cervical spine on 11/19/2020 showed mild noncompressive disc bulging at C3-4 through C6-7 without significant stenosis or neural impingement.    She underwent 8 weeks of therapy including adjustments and dry needling which did not produce significant improvement.  She underwent VNG testing in July in which findings significant for cervical vertigo, particularly in head right position but due to intolerance, the caloric testing was not completed and could not rule out left unilateral vestibular hypofunction.  Continued PT/OMT for cervical vertigo, VOR strengthening exercises was recommended.     Past medications:  Tizanidine (she felt it was too strong), Lexapro (side effects), sertraline (side effects), nortriptyline (25mg  caused side effects), Cymbalta (side effects)  PAST MEDICAL HISTORY: Past Medical History:  Diagnosis Date   Anxiety    Chronic tension headaches    GAD (generalized anxiety disorder)    Mild tricuspid regurgitation    Panic attacks    Seasonal allergies    Somatic dysfunction of cervical region    Tinnitus    Vertigo     MEDICATIONS: Current Outpatient Medications on File Prior to Visit  Medication Sig Dispense Refill   ALPRAZolam (XANAX) 0.5 MG tablet Take 1 tablet (0.5 mg total) by mouth daily as needed for anxiety. 30 tablet 0   DULoxetine (CYMBALTA) 30 MG capsule Take 1 capsule (30 mg total) by mouth daily. 30 capsule 3   levonorgestrel (MIRENA) 20 MCG/24HR IUD 1 each by Intrauterine route once.     promethazine (PHENERGAN) 25 MG tablet Take 1 tablet (25  mg total) by mouth every 6 (six) hours as needed for nausea or vomiting. 20 tablet 0   No current facility-administered medications on file prior to visit.    ALLERGIES: No Known Allergies  FAMILY HISTORY: Family History  Problem Relation Age of Onset   Hypertension Mother     Hyperlipidemia Mother    Hypertension Father    Hyperlipidemia Father    Birth defects Daughter        1 kidney   Lung cancer Maternal Grandmother    Stroke Paternal Grandmother    Stroke Paternal Grandfather    Healthy Daughter    Healthy Brother    Colon cancer Neg Hx    Stomach cancer Neg Hx    Rectal cancer Neg Hx    Pancreatic cancer Neg Hx       Objective:  *** General: No acute distress.  Patient appears well-groomed.   Head:  Normocephalic/atraumatic Eyes:  Fundi examined but not visualized Neck: supple, no paraspinal tenderness, full range of motion Heart:  Regular rate and rhythm Lungs:  Clear to auscultation bilaterally Back: No paraspinal tenderness Neurological Exam: alert and oriented to person, place, and time.  Speech fluent and not dysarthric, language intact.  CN II-XII intact. Bulk and tone normal, muscle strength 5/5 throughout.  Sensation to light touch intact.  Deep tendon reflexes 2+ throughout, toes downgoing.  Finger to nose testing intact.  Gait normal, Romberg negative.   Shon Millet, DO  CC: Brett Fairy, MD

## 2022-06-04 ENCOUNTER — Ambulatory Visit: Payer: 59 | Admitting: Neurology

## 2022-06-04 ENCOUNTER — Encounter: Payer: Self-pay | Admitting: Neurology

## 2022-06-04 ENCOUNTER — Other Ambulatory Visit (INDEPENDENT_AMBULATORY_CARE_PROVIDER_SITE_OTHER): Payer: 59

## 2022-06-04 VITALS — BP 115/73 | HR 75 | Ht 59.0 in | Wt 133.0 lb

## 2022-06-04 DIAGNOSIS — R29898 Other symptoms and signs involving the musculoskeletal system: Secondary | ICD-10-CM | POA: Diagnosis not present

## 2022-06-04 DIAGNOSIS — M542 Cervicalgia: Secondary | ICD-10-CM

## 2022-06-04 DIAGNOSIS — R200 Anesthesia of skin: Secondary | ICD-10-CM

## 2022-06-04 DIAGNOSIS — G44219 Episodic tension-type headache, not intractable: Secondary | ICD-10-CM

## 2022-06-04 DIAGNOSIS — R202 Paresthesia of skin: Secondary | ICD-10-CM

## 2022-06-04 LAB — CK: Total CK: 105 U/L (ref 7–177)

## 2022-06-04 NOTE — Patient Instructions (Signed)
Buy a pair of wrist splints and wear them at night.  If no improvement in numbness, contact me and we can order a nerve conduction study/EMG Will check CK

## 2022-09-03 NOTE — Progress Notes (Unsigned)
NEUROLOGY FOLLOW UP OFFICE NOTE  Christine Carey [id number]  Assessment/Plan:   Tension-type headache, not intractable - increased - likely secondary to anxiety Cervicalgia  Generalized anxiety disorder Myalgias - I do not suspect underlying myopathy.  CK normal.  Strength intact. Disequilibrium - I do not appreciate any significant abnormality on exam    Start nortriptyline 10mg  at bedtime.  If no improvement in 6 weeks, consider either increasing dose again to 25mg  or switch to zonisamide. Limit use of pain relievers to no more than 2 days out of week to prevent risk of rebound or medication-overuse headache. Keep headache diary Follow up 4 months.     Subjective:  Christine Carey is a 36 year old right-handed female who follows up for dizziness, headache and cervicalgia.  She is accompanied by her mother.   UPDATE: Current medications: alprazolam 0.5mg  daily PRN, promethazine, 5-HTP   She started experiencing muscle fatigue in her legs.  No spasms or cramps.  CK from 06/04/2022 was 105.  At night in bed, she notes tingling in the arms.   She was advised to wear wrist splints.  She wore them for a couple of days and the numbness resolved.  She may feel it for a bit if she leans a certain way.    She has a new concern.  A month ago, she drove back from [place].  She had anxiety while in 08/04/2022. She noticed that she started swaying when she would stand.  She still reports swaying.    Headaches and neck pain have increased.  Headaches (pressure across forehead) 5 days a week.  She takes ibuprofen if needed (about 3 to 4 days a week).  Notes neck tightness in back of neck and suboccipital region.     HISTORY: In late March 2021, she received her first [place] vaccine.  In early April 2021, she developed vertigo/spinning episode.  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  Then next day, she felt off and developed brief spinning when bending  over or other change in position.  She started experiencing sinus pressure.  She was evaluated by ENT and allergist.  She was found to have allergies to pollen.  She was noted to have inflammation in her nose and treated with antihistamines and steroids, which were ineffective.  She was started on cefdinir for sinus infection. When she finished, she began experiencing severe nausea and dry heaves.  She returned to the ED in May.  CT head showed normal sinuses and no acute intracranial abnormality and was started on augmentin for presumed sinusitis.  She began experiencing aural fullness and tinnitus.  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  She reported some neck pain and cracking in her neck with movement.  She went to physical therapy and later a chiropractor.  Cervical spine X-ray reportedly showed that her C1-2 was "off".  MRI of cervical spine on 11/19/2020 showed mild noncompressive disc bulging at C3-4 through C6-7 without significant stenosis or neural impingement.    She underwent 8 weeks of therapy including adjustments and dry needling which did not produce significant improvement.  She underwent VNG testing in July 2021 in which findings significant for cervical vertigo, particularly in head right position but due to intolerance, the caloric testing was not completed and could not rule out left unilateral vestibular hypofunction.  Continued PT/OMT for cervical vertigo, VOR strengthening exercises was recommended.  She had MRI of brain with and without contrast on 05/12/2022 was normal.  MRI of lumbar spine on 05/22/2022 personally reviewed was normal.    Past medications:  Tizanidine (she felt it was too strong), Lexapro (side effects), sertraline (side effects), nortriptyline (25mg  caused side effects), Cymbalta (side effects)  PAST MEDICAL HISTORY: Past Medical History:  Diagnosis Date   Anxiety    Chronic tension headaches    GAD (generalized  anxiety disorder)    Mild tricuspid regurgitation    Panic attacks    Seasonal allergies    Somatic dysfunction of cervical region    Tinnitus    Vertigo     MEDICATIONS: Current Outpatient Medications on File Prior to Visit  Medication Sig Dispense Refill   5-Hydroxytryptophan (5-HTP) 50 MG TABS Take by mouth.     ALPRAZolam (XANAX) 0.5 MG tablet Take 1 tablet (0.5 mg total) by mouth daily as needed for anxiety. 30 tablet 0   Ascorbic Acid (VITAMIN C) 100 MG tablet Take 100 mg by mouth daily.     b complex vitamins capsule Take 1 capsule by mouth daily.     magnesium 30 MG tablet Take 30 mg by mouth 2 (two) times daily.     Melatonin-Pyridoxine (MELATIN PO) Take by mouth.     promethazine (PHENERGAN) 25 MG tablet Take 1 tablet (25 mg total) by mouth every 6 (six) hours as needed for nausea or vomiting. 20 tablet 0   No current facility-administered medications on file prior to visit.     ALLERGIES: No Known Allergies  FAMILY HISTORY: Family History  Problem Relation Age of Onset   Hypertension Mother    Hyperlipidemia Mother    Hypertension Father    Hyperlipidemia Father    Birth defects Daughter        1 kidney   Lung cancer Maternal Grandmother    Stroke Paternal Grandmother    Stroke Paternal Grandfather    Healthy Daughter    Healthy Brother    Colon cancer Neg Hx    Stomach cancer Neg Hx    Rectal cancer Neg Hx    Pancreatic cancer Neg Hx       Objective:  Blood pressure 110/70, pulse 87, height 4\' 11"  (1.499 m), weight 129 lb 6.4 oz (58.7 kg), SpO2 99 %, unknown if currently breastfeeding. General: No acute distress.  Patient appears well-groomed.   Head:  Normocephalic/atraumatic Eyes:  Fundi examined but not visualized Neck: supple, no paraspinal tenderness, full range of motion Heart:  Regular rate and rhythm Neurological Exam: alert and oriented to person, place, and time.  Speech fluent and not dysarthric, language intact.  CN II-XII intact. Bulk  and tone normal, muscle strength 5/5 throughout.  Sensation to light touch intact.  Deep tendon reflexes 2+ throughout, toes downgoing.  Finger to nose testing intact.  Gait normal, Romberg negative.   , DO  CC: , MD

## 2022-09-04 ENCOUNTER — Encounter: Payer: Self-pay | Admitting: Neurology

## 2022-09-04 ENCOUNTER — Ambulatory Visit: Payer: 59 | Admitting: Neurology

## 2022-09-04 VITALS — BP 110/70 | HR 87 | Ht 59.0 in | Wt 129.4 lb

## 2022-09-04 DIAGNOSIS — F411 Generalized anxiety disorder: Secondary | ICD-10-CM | POA: Diagnosis not present

## 2022-09-04 DIAGNOSIS — G44219 Episodic tension-type headache, not intractable: Secondary | ICD-10-CM

## 2022-09-04 MED ORDER — NORTRIPTYLINE HCL 10 MG PO CAPS: ORAL_CAPSULE | ORAL | 5 refills | Status: AC

## 2022-09-04 NOTE — Patient Instructions (Signed)
Start nortriptyline 10mg  at bedtime.  If no improvement in headaches in 6 weeks, contact me.  An alternative medication to try is zonisamide Limit use of pain relievers to no more than 2 days out of week to prevent risk of rebound or medication-overuse headache. Keep headache diary Follow up 4 months.

## 2022-09-12 ENCOUNTER — Encounter: Payer: Self-pay | Admitting: Neurology

## 2023-01-29 ENCOUNTER — Ambulatory Visit: Payer: 59 | Admitting: Neurology
